# Patient Record
Sex: Male | Born: 1970 | Race: Black or African American | Hispanic: No | Marital: Married | Smoking: Never smoker
Health system: Southern US, Community
[De-identification: ages and names within clinical notes are randomized; demographics above are authoritative.]

## PROBLEM LIST (undated history)

## (undated) DIAGNOSIS — I10 Essential (primary) hypertension: Secondary | ICD-10-CM

## (undated) HISTORY — DX: Essential (primary) hypertension: I10

---

## 2004-07-16 ENCOUNTER — Emergency Department (HOSPITAL_COMMUNITY): Admission: EM | Admit: 2004-07-16 | Discharge: 2004-07-16 | Payer: Self-pay | Admitting: Emergency Medicine

## 2004-07-26 ENCOUNTER — Emergency Department (HOSPITAL_COMMUNITY): Admission: EM | Admit: 2004-07-26 | Discharge: 2004-07-26 | Payer: Self-pay | Admitting: Family Medicine

## 2005-09-29 ENCOUNTER — Emergency Department (HOSPITAL_COMMUNITY): Admission: EM | Admit: 2005-09-29 | Discharge: 2005-09-29 | Payer: Self-pay | Admitting: Emergency Medicine

## 2006-11-11 ENCOUNTER — Emergency Department (HOSPITAL_COMMUNITY): Admission: EM | Admit: 2006-11-11 | Discharge: 2006-11-11 | Payer: Self-pay | Admitting: Family Medicine

## 2010-12-01 LAB — GC/CHLAMYDIA PROBE AMP, GENITAL
Chlamydia, DNA Probe: NEGATIVE
GC Probe Amp, Genital: NEGATIVE

## 2012-05-11 ENCOUNTER — Other Ambulatory Visit: Payer: Self-pay | Admitting: *Deleted

## 2012-05-13 MED ORDER — OLMESARTAN MEDOXOMIL-HCTZ 40-25 MG PO TABS: 1.0000 | ORAL_TABLET | Freq: Every day | ORAL | Status: DC

## 2012-05-30 ENCOUNTER — Telehealth: Payer: Self-pay | Admitting: Physician Assistant

## 2012-05-30 NOTE — Telephone Encounter (Signed)
appt made

## 2012-06-03 ENCOUNTER — Ambulatory Visit (INDEPENDENT_AMBULATORY_CARE_PROVIDER_SITE_OTHER): Payer: Managed Care, Other (non HMO) | Admitting: Family Medicine

## 2012-06-03 ENCOUNTER — Encounter: Payer: Self-pay | Admitting: Family Medicine

## 2012-06-03 VITALS — BP 112/68 | HR 43 | Temp 97.1°F | Ht 72.5 in | Wt 249.0 lb

## 2012-06-03 DIAGNOSIS — IMO0002 Reserved for concepts with insufficient information to code with codable children: Secondary | ICD-10-CM

## 2012-06-03 DIAGNOSIS — M545 Low back pain, unspecified: Secondary | ICD-10-CM | POA: Insufficient documentation

## 2012-06-03 DIAGNOSIS — S39012A Strain of muscle, fascia and tendon of lower back, initial encounter: Secondary | ICD-10-CM

## 2012-06-03 DIAGNOSIS — I10 Essential (primary) hypertension: Secondary | ICD-10-CM | POA: Insufficient documentation

## 2012-06-03 DIAGNOSIS — E785 Hyperlipidemia, unspecified: Secondary | ICD-10-CM | POA: Insufficient documentation

## 2012-06-03 MED ORDER — OLMESARTAN MEDOXOMIL-HCTZ 40-25 MG PO TABS: 1.0000 | ORAL_TABLET | Freq: Every day | ORAL | Status: DC

## 2012-06-03 NOTE — Progress Notes (Signed)
Patient ID: Alex Howell, male   DOB: 1970-07-13, 42 y.o.   MRN: [id number] SUBJECTIVE: HPI: Pulled his back at work, was lifting a tote, at approximately 80 lbs. Symptoms 6 days Location:left lumbar Duration: 6 days                   is acute     is not chronic  is not associated with radiation of pain. is associated with an injury  have not had an injury of the back in the past. Severity of pain: 1-2   /10 when he moves Description of the pain: is not burning, is aching, is notlancinating,  is associated with the pain coming and going. is not associated with a rash. is better with anything tried or done.with ice is worse with anything tried or done.with movement is not associated with a change in strength, sensation, gait or a feeling of numbness, in the lower extremities. is not associated with a change in bowels and/or bladder. have not had an Xray of the back in the past.  Medications tried:icey hot Patient is here for follow up of hypertension: denies Headache;deniesChest Pain;denies weakness;denies Shortness of Breath or Orthopnea;denies Visual changes;denies palpitations;denies cough;denies pedal edema;denies symptoms of TIA or stroke; admits to Compliance with medications. denies Problems with medications.   PMH/PSH: reviewed/updated in Epic  SH/FH: reviewed/updated in Epic  Allergies: reviewed/updated in Epic  Medications: reviewed/updated in Epic  Immunizations: reviewed/updated in Epic  ROS: As above in the HPI. All other systems are stable or negative.  OBJECTIVE: APPEARANCE:  Obese african [place] male Patient in no acute distress.The patient appeared well nourished and normally developed. Acyanotic. Waist:42 1/4 inches VITAL SIGNS:BP 112/68  Pulse 43  Temp(Src) 97.1 F (36.2 C) (Oral)  Ht 6' 0.5" (1.842 m)  Wt 249 lb (112.946 kg)  BMI 33.29 kg/m2   SKIN: warm and  Dry without overt rashes, tattoos and scars  HEAD and Neck: without  JVD, Head and scalp: normal Eyes:No scleral icterus. Fundi normal, eye movements normal. Ears: Auricle normal, canal normal, Tympanic membranes normal, insufflation normal. Nose: normal Throat: normal Neck & thyroid: normal  CHEST & LUNGS: Chest wall: normal Lungs: Clear  CVS: Reveals the PMI to be normally located. Regular rhythm, First and Second Heart sounds are normal,  absence of murmurs, rubs or gallops. Peripheral vasculature: Radial pulses: normal Dorsal pedis pulses: normal Posterior pulses: normal  ABDOMEN:  Appearance: normal Benign,, no organomegaly, no masses, no Abdominal Aortic enlargement. No Guarding , no rebound. No Bruits. Bowel sounds: normal  RECTAL: N/A GU: N/A  EXTREMETIES: nonedematous. Both Femoral and Pedal pulses are normal.  MUSCULOSKELETAL:  Spine: left lumbar erector muscle mild tenderness. Joints: intact  NEUROLOGIC: oriented to time,place and person; nonfocal. Strength is normal Sensory is normal Reflexes are normal Cranial Nerves are normal.  ASSESSMENT: HTN, goal below 130/80 - Plan: COMPLETE METABOLIC PANEL WITH GFR  Dyslipidemia - Plan: NMR Lipoprofile with Lipids  Back sprain or strain, initial encounter  Obesity/metabolic syndrome: risks long term discussed at length.  PLAN:      Dr Woodroe Mode Recommendations  Diet and Exercise discussed with patient.  For nutrition information, I recommend books:  1).Eat to Live by Dr Monico Hoar. 2).Prevent and Reverse Heart Disease by Dr Suzzette Righter.  Exercise recommendations are:  If unable to walk, then the patient can exercise in a chair 3 times a day. By flapping arms like a bird gently and raising legs outwards to the front.  If ambulatory, the patient can go for walks for 30 minutes 3 times a week. Then increase the intensity and duration as tolerated.  Goal is to try to attain exercise frequency to 5 times a week.  If applicable: Best to perform  resistance exercises (machines or weights) 2 days a week and cardio type exercises 3 days per week.  Orders Placed This Encounter  Procedures  . COMPLETE METABOLIC PANEL WITH GFR    Standing Status: Future     Number of Occurrences:      Standing Expiration Date: 06/03/2013  . NMR Lipoprofile with Lipids    Standing Status: Future     Number of Occurrences:      Standing Expiration Date: 06/03/2013   No results found for this or any previous visit (from the past 24 hour(s)). Meds ordered this encounter  Medications  . olmesartan-hydrochlorothiazide (BENICAR HCT) 40-25 MG per tablet    Sig: Take 1 tablet by mouth daily.    Dispense:  30 tablet    Refill:  5    NTBS    back care, heat , muscle rub.(icey hot)  Compliance discussed. Handouts in the AVS Weight reduction exercise. Jakaiden Fill P. Modesto Charon, M.D.

## 2012-06-03 NOTE — Patient Instructions (Addendum)
Dr Woodroe Mode Recommendations  Diet and Exercise discussed with patient.  For nutrition information, I recommend books:  1).Eat to Live by Dr Monico Hoar. 2).Prevent and Reverse Heart Disease by Dr Suzzette Righter.  Exercise recommendations are:  If unable to walk, then the patient can exercise in a chair 3 times a day. By flapping arms like a bird gently and raising legs outwards to the front.  If ambulatory, the patient can go for walks for 30 minutes 3 times a week. Then increase the intensity and duration as tolerated.  Goal is to try to attain exercise frequency to 5 times a week.  If applicable: Best to perform resistance exercises (machines or weights) 2 days a week and cardio type exercises 3 days per week.   Hypertension As your heart beats, it forces blood through your arteries. This force is your blood pressure. If the pressure is too high, it is called hypertension (HTN) or high blood pressure. HTN is dangerous because you may have it and not know it. High blood pressure may mean that your heart has to work harder to pump blood. Your arteries may be narrow or stiff. The extra work puts you at risk for heart disease, stroke, and other problems.  Blood pressure consists of two numbers, a higher number over a lower, 110/72, for example. It is stated as "110 over 72." The ideal is below 120 for the top number (systolic) and under 80 for the bottom (diastolic). Write down your blood pressure today. You should pay close attention to your blood pressure if you have certain conditions such as:  Heart failure.  Prior heart attack.  Diabetes  Chronic kidney disease.  Prior stroke.  Multiple risk factors for heart disease. To see if you have HTN, your blood pressure should be measured while you are seated with your arm held at the level of the heart. It should be measured at least twice. A one-time elevated blood pressure reading (especially in the Emergency  Department) does not mean that you need treatment. There may be conditions in which the blood pressure is different between your right and left arms. It is important to see your caregiver soon for a recheck. Most people have essential hypertension which means that there is not a specific cause. This type of high blood pressure may be lowered by changing lifestyle factors such as:  Stress.  Smoking.  Lack of exercise.  Excessive weight.  Drug/tobacco/alcohol use.  Eating less salt. Most people do not have symptoms from high blood pressure until it has caused damage to the body. Effective treatment can often prevent, delay or reduce that damage. TREATMENT  When a cause has been identified, treatment for high blood pressure is directed at the cause. There are a large number of medications to treat HTN. These fall into several categories, and your caregiver will help you select the medicines that are best for you. Medications may have side effects. You should review side effects with your caregiver. If your blood pressure stays high after you have made lifestyle changes or started on medicines,   Your medication(s) may need to be changed.  Other problems may need to be addressed.  Be certain you understand your prescriptions, and know how and when to take your medicine.  Be sure to follow up with your caregiver within the time frame advised (usually within two weeks) to have your blood pressure rechecked and to review your medications.  If you are taking  more than one medicine to lower your blood pressure, make sure you know how and at what times they should be taken. Taking two medicines at the same time can result in blood pressure that is too low. SEEK IMMEDIATE MEDICAL CARE IF:  You develop a severe headache, blurred or changing vision, or confusion.  You have unusual weakness or numbness, or a faint feeling.  You have severe chest or abdominal pain, vomiting, or breathing  problems. MAKE SURE YOU:   Understand these instructions.  Will watch your condition.  Will get help right away if you are not doing well or get worse. Document Released: 02/06/2005 Document Revised: 05/01/2011 Document Reviewed: 09/27/2007 [facility] Patient Information 2013 [place], [place].

## 2012-12-03 ENCOUNTER — Ambulatory Visit: Payer: Managed Care, Other (non HMO) | Admitting: Family Medicine

## 2013-02-17 ENCOUNTER — Other Ambulatory Visit: Payer: Self-pay | Admitting: Nurse Practitioner

## 2013-05-30 ENCOUNTER — Ambulatory Visit (INDEPENDENT_AMBULATORY_CARE_PROVIDER_SITE_OTHER): Payer: Managed Care, Other (non HMO) | Admitting: Family Medicine

## 2013-05-30 VITALS — BP 147/89 | HR 51 | Temp 97.5°F | Ht 72.5 in | Wt 261.6 lb

## 2013-05-30 DIAGNOSIS — IMO0002 Reserved for concepts with insufficient information to code with codable children: Secondary | ICD-10-CM

## 2013-05-30 DIAGNOSIS — S76312A Strain of muscle, fascia and tendon of the posterior muscle group at thigh level, left thigh, initial encounter: Secondary | ICD-10-CM

## 2013-05-30 MED ORDER — NAPROXEN 500 MG PO TABS: 500.0000 mg | ORAL_TABLET | Freq: Two times a day (BID) | ORAL | Status: DC

## 2013-05-30 NOTE — Progress Notes (Signed)
   Subjective:    Patient ID: Encarnacion ChuMilton D Spurgin, male    DOB: 1970/09/16, 43 y.o.   MRN: [id number]  HPI  This 43 y.o. male presents for evaluation of left leg pain and discomfort.  He was out racing the [employer] yesterday and pulled a muscle in his left leg.  Review of Systems    No chest pain, SOB, HA, dizziness, vision change, N/V, diarrhea, constipation, dysuria, urinary urgency or frequency, myalgias, arthralgias or rash.  Objective:   Physical Exam Vital signs noted  Well developed well nourished male.  HEENT - Head atraumatic Normocephalic MS - TTP left hamstring        Assessment & Plan:  Left hamstring muscle strain - Plan: naproxen (NAPROSYN) 500 MG tablet Po bid x 2 weeks.  Use heating pad prn  Deatra CanterWilliam J Venda Dice FNP

## 2013-06-20 ENCOUNTER — Other Ambulatory Visit: Payer: Self-pay | Admitting: Family Medicine

## 2013-06-30 ENCOUNTER — Ambulatory Visit (INDEPENDENT_AMBULATORY_CARE_PROVIDER_SITE_OTHER): Payer: Managed Care, Other (non HMO) | Admitting: Family

## 2013-06-30 ENCOUNTER — Encounter: Payer: Self-pay | Admitting: Family

## 2013-06-30 VITALS — BP 154/86 | HR 46 | Temp 98.7°F | Ht 72.05 in | Wt 258.0 lb

## 2013-06-30 DIAGNOSIS — Z0000 Encounter for general adult medical examination without abnormal findings: Secondary | ICD-10-CM

## 2013-06-30 DIAGNOSIS — Z23 Encounter for immunization: Secondary | ICD-10-CM

## 2013-06-30 DIAGNOSIS — I10 Essential (primary) hypertension: Secondary | ICD-10-CM

## 2013-06-30 MED ORDER — OLMESARTAN MEDOXOMIL-HCTZ 40-25 MG PO TABS
1.0000 | ORAL_TABLET | Freq: Every day | ORAL | Status: DC
Start: 2013-06-30 — End: 2014-05-01

## 2013-06-30 NOTE — Patient Instructions (Addendum)
Tetanus, Diphtheria, Pertussis (Tdap) Vaccine What You Need to Know WHY GET VACCINATED? Tetanus, diphtheria and pertussis can be very serious diseases, even for adolescents and adults. Tdap vaccine can protect us from these diseases. TETANUS (Lockjaw) causes painful muscle tightening and stiffness, usually all over the body.  It can lead to tightening of muscles in the head and neck so you can't open your mouth, swallow, or sometimes even breathe. Tetanus kills about 1 out of 5 people who are infected. DIPHTHERIA can cause a thick coating to form in the back of the throat.  It can lead to breathing problems, paralysis, heart failure, and death. PERTUSSIS (Whooping Cough) causes severe coughing spells, which can cause difficulty breathing, vomiting and disturbed sleep.  It can also lead to weight loss, incontinence, and rib fractures. Up to 2 in 100 adolescents and 5 in 100 adults with pertussis are hospitalized or have complications, which could include pneumonia and death. These diseases are caused by bacteria. Diphtheria and pertussis are spread from person to person through coughing or sneezing. Tetanus enters the body through cuts, scratches, or wounds. Before vaccines, the [place] saw as many as 200,000 cases a year of diphtheria and pertussis, and hundreds of cases of tetanus. Since vaccination began, tetanus and diphtheria have dropped by about 99% and pertussis by about 80%. TDAP VACCINE Tdap vaccine can protect adolescents and adults from tetanus, diphtheria, and pertussis. One dose of Tdap is routinely given at age 11 or 12. People who did not get Tdap at that age should get it as soon as possible. Tdap is especially important for health care professionals and anyone having close contact with a baby younger than 12 months. Pregnant women should get a dose of Tdap during every pregnancy, to protect the newborn from pertussis. Infants are most at risk for severe, life-threatening  complications from pertussis. A similar vaccine, called Td, protects from tetanus and diphtheria, but not pertussis. A Td booster should be given every 10 years. Tdap may be given as one of these boosters if you have not already gotten a dose. Tdap may also be given after a severe cut or burn to prevent tetanus infection. Your doctor can give you more information. Tdap may safely be given at the same time as other vaccines. SOME PEOPLE SHOULD NOT GET THIS VACCINE  If you ever had a life-threatening allergic reaction after a dose of any tetanus, diphtheria, or pertussis containing vaccine, OR if you have a severe allergy to any part of this vaccine, you should not get Tdap. Tell your doctor if you have any severe allergies.  If you had a coma, or long or multiple seizures within 7 days after a childhood dose of DTP or DTaP, you should not get Tdap, unless a cause other than the vaccine was found. You can still get Td.  Talk to your doctor if you:  have epilepsy or another nervous system problem,  had severe pain or swelling after any vaccine containing diphtheria, tetanus or pertussis,  ever had Guillain-Barr Syndrome (GBS),  aren't feeling well on the day the shot is scheduled. RISKS OF A VACCINE REACTION With any medicine, including vaccines, there is a chance of side effects. These are usually mild and go away on their own, but serious reactions are also possible. Brief fainting spells can follow a vaccination, leading to injuries from falling. Sitting or lying down for about 15 minutes can help prevent these. Tell your doctor if you feel dizzy or light-headed, or   have vision changes or ringing in the ears. Mild problems following Tdap (Did not interfere with activities)  Pain where the shot was given (about 3 in 4 adolescents or 2 in 3 adults)  Redness or swelling where the shot was given (about 1 person in 5)  Mild fever of at least 100.4F (up to about 1 in 25 adolescents or 1 in  100 adults)  Headache (about 3 or 4 people in 10)  Tiredness (about 1 person in 3 or 4)  Nausea, vomiting, diarrhea, stomach ache (up to 1 in 4 adolescents or 1 in 10 adults)  Chills, body aches, sore joints, rash, swollen glands (uncommon) Moderate problems following Tdap (Interfered with activities, but did not require medical attention)  Pain where the shot was given (about 1 in 5 adolescents or 1 in 100 adults)  Redness or swelling where the shot was given (up to about 1 in 16 adolescents or 1 in 25 adults)  Fever over 102F (about 1 in 100 adolescents or 1 in 250 adults)  Headache (about 3 in 20 adolescents or 1 in 10 adults)  Nausea, vomiting, diarrhea, stomach ache (up to 1 or 3 people in 100)  Swelling of the entire arm where the shot was given (up to about 3 in 100). Severe problems following Tdap (Unable to perform usual activities, required medical attention)  Swelling, severe pain, bleeding and redness in the arm where the shot was given (rare). A severe allergic reaction could occur after any vaccine (estimated less than 1 in a million doses). WHAT IF THERE IS A SERIOUS REACTION? What should I look for?  Look for anything that concerns you, such as signs of a severe allergic reaction, very high fever, or behavior changes. Signs of a severe allergic reaction can include hives, swelling of the face and throat, difficulty breathing, a fast heartbeat, dizziness, and weakness. These would start a few minutes to a few hours after the vaccination. What should I do?  If you think it is a severe allergic reaction or other emergency that can't wait, call 9-1-1 or get the person to the nearest hospital. Otherwise, call your doctor.  Afterward, the reaction should be reported to the "Vaccine Adverse Event Reporting System" (VAERS). Your doctor might file this report, or you can do it yourself through the VAERS web site at www.vaers.hhs.gov, or by calling 1-800-822-7967. VAERS is  only for reporting reactions. They do not give medical advice.  THE NATIONAL VACCINE INJURY COMPENSATION PROGRAM The National Vaccine Injury Compensation Program (VICP) is a federal program that was created to compensate people who may have been injured by certain vaccines. Persons who believe they may have been injured by a vaccine can learn about the program and about filing a claim by calling 1-800-338-2382 or visiting the VICP website at www.hrsa.gov/vaccinecompensation. HOW CAN I LEARN MORE?  Ask your doctor.  Call your local or state health department.  Contact the Centers for Disease Control and Prevention (CDC):  Call 1-800-232-4636 or visit CDC's website at www.cdc.gov/vaccines. CDC Tdap Vaccine VIS (06/29/11) Document Released: 08/08/2011 Document Revised: 06/03/2012 Document Reviewed: 05/29/2012 [employer] Patient Information 2014 [employer]. Hypertension As your heart beats, it forces blood through your arteries. This force is your blood pressure. If the pressure is too high, it is called hypertension (HTN) or high blood pressure. HTN is dangerous because you may have it and not know it. High blood pressure may mean that your heart has to work harder to pump blood. Your arteries   may be narrow or stiff. The extra work puts you at risk for heart disease, stroke, and other problems.  Blood pressure consists of two numbers, a higher number over a lower, 110/72, for example. It is stated as "110 over 72." The ideal is below 120 for the top number (systolic) and under 80 for the bottom (diastolic). Write down your blood pressure today. You should pay close attention to your blood pressure if you have certain conditions such as:  Heart failure.  Prior heart attack.  Diabetes  Chronic kidney disease.  Prior stroke.  Multiple risk factors for heart disease. To see if you have HTN, your blood pressure should be measured while you are seated with your arm held at the level of the  heart. It should be measured at least twice. A one-time elevated blood pressure reading (especially in the Emergency Department) does not mean that you need treatment. There may be conditions in which the blood pressure is different between your right and left arms. It is important to see your caregiver soon for a recheck. Most people have essential hypertension which means that there is not a specific cause. This type of high blood pressure may be lowered by changing lifestyle factors such as:  Stress.  Smoking.  Lack of exercise.  Excessive weight.  Drug/tobacco/alcohol use.  Eating less salt. Most people do not have symptoms from high blood pressure until it has caused damage to the body. Effective treatment can often prevent, delay or reduce that damage. TREATMENT  When a cause has been identified, treatment for high blood pressure is directed at the cause. There are a large number of medications to treat HTN. These fall into several categories, and your caregiver will help you select the medicines that are best for you. Medications may have side effects. You should review side effects with your caregiver. If your blood pressure stays high after you have made lifestyle changes or started on medicines,   Your medication(s) may need to be changed.  Other problems may need to be addressed.  Be certain you understand your prescriptions, and know how and when to take your medicine.  Be sure to follow up with your caregiver within the time frame advised (usually within two weeks) to have your blood pressure rechecked and to review your medications.  If you are taking more than one medicine to lower your blood pressure, make sure you know how and at what times they should be taken. Taking two medicines at the same time can result in blood pressure that is too low. SEEK IMMEDIATE MEDICAL CARE IF:  You develop a severe headache, blurred or changing vision, or confusion.  You have unusual  weakness or numbness, or a faint feeling.  You have severe chest or abdominal pain, vomiting, or breathing problems. MAKE SURE YOU:   Understand these instructions.  Will watch your condition.  Will get help right away if you are not doing well or get worse. Document Released: 02/06/2005 Document Revised: 05/01/2011 Document Reviewed: 09/27/2007 [employer] Patient Information 2014 [employer].  

## 2013-06-30 NOTE — Progress Notes (Signed)
   Subjective:    Patient ID: Alex ChuMilton Howell Nurse, male    DOB: [birth year], 43 y.o.   MRN: [id number]  Hypertension This is a chronic problem. The current episode started more than 1 year ago. The problem has been waxing and waning since onset. The problem is uncontrolled (Pt has not taken BP meds last two weeks-States he "ran out"). Pertinent negatives include no anxiety, blurred vision, chest pain, palpitations, peripheral edema or shortness of breath. Risk factors for coronary artery disease include male gender and obesity. Past treatments include lifestyle changes (Benicar). The current treatment provides moderate improvement. Compliance problems include exercise.  There is no history of kidney disease, CAD/MI, heart failure or a thyroid problem.      Review of Systems  Eyes: Negative for blurred vision.  Respiratory: Negative for shortness of breath.   Cardiovascular: Negative for chest pain and palpitations.  All other systems reviewed and are negative.      Objective:   Physical Exam  Vitals reviewed. Constitutional: He is oriented to person, place, and time. He appears well-developed and well-nourished. No distress.  HENT:  Head: Normocephalic.  Right Ear: External ear normal.  Left Ear: External ear normal.  Mouth/Throat: Oropharynx is clear and moist.  Eyes: Pupils are equal, round, and reactive to light. Right eye exhibits no discharge. Left eye exhibits no discharge.  Neck: Normal range of motion. Neck supple. No thyromegaly present.  Cardiovascular: Normal rate, regular rhythm, normal heart sounds and intact distal pulses.   No murmur heard. Pulmonary/Chest: Effort normal and breath sounds normal. No respiratory distress. He has no wheezes.  Abdominal: Soft. Bowel sounds are normal. He exhibits no distension. There is no tenderness.  Musculoskeletal: Normal range of motion. He exhibits no edema and no tenderness.  Neurological: He is alert and oriented to person, place,  and time. He has normal reflexes. No cranial nerve deficit.  Skin: Skin is warm and dry. No rash noted. No erythema.  Psychiatric: He has a normal mood and affect. His behavior is normal. Judgment and thought content normal.     BP 154/86  Pulse 46  Temp(Src) 98.7 F (37.1 C) (Oral)  Ht 6' 0.05" (1.83 m)  Wt 258 lb (117.028 kg)  BMI 34.95 kg/m2      Assessment & Plan:  1. HTN, goal below 130/80 -Daily blood pressure log given with instructions on how to fill out and told to bring to next visit -Dash diet information given -Exercise encouraged - Stress Management  -Continue current meds- Pt has not taken any BP meds for the last two weeks -Pt educated if BP not <130s/80s to schedule appointment for BP meds to be increased - olmesartan-hydrochlorothiazide (BENICAR HCT) 40-25 MG per tablet; Take 1 tablet by mouth daily.  Dispense: 90 tablet; Refill: 3 - Basic Metabolic Panel  2. Annual physical exam - Basic Metabolic Panel - PSA - Lipid Panel   Continue all meds Labs pending Health Maintenance reviewed Diet and exercise encouraged RTO 1 year or if BP does not come to goal pt will make f/u with next 3-4 weeks  Jannifer Rodneyhristy Novah Nessel, FNP

## 2013-07-01 LAB — LIPID PANEL
Chol/HDL Ratio: 4.4 ratio units (ref 0.0–5.0)
Cholesterol, Total: 171 mg/dL (ref 100–199)
HDL: 39 mg/dL — AB (ref >39)
LDL CALC: 119 mg/dL — AB (ref 0–99)
Triglycerides: 66 mg/dL (ref 0–149)
VLDL CHOLESTEROL CAL: 13 mg/dL (ref 5–40)

## 2013-07-01 LAB — BASIC METABOLIC PANEL
BUN / CREAT RATIO: 9 (ref 9–20)
BUN: 8 mg/dL (ref 6–24)
CHLORIDE: 105 mmol/L (ref 97–108)
CO2: 25 mmol/L (ref 18–29)
Calcium: 9.4 mg/dL (ref 8.7–10.2)
Creatinine, Ser: 0.85 mg/dL (ref 0.76–1.27)
GFR, EST AFRICAN AMERICAN: 123 mL/min/{1.73_m2} (ref >59)
GFR, EST NON AFRICAN AMERICAN: 107 mL/min/{1.73_m2} (ref >59)
GLUCOSE: 101 mg/dL — AB (ref 65–99)
POTASSIUM: 4.3 mmol/L (ref 3.5–5.2)
SODIUM: 141 mmol/L (ref 134–144)

## 2013-07-01 LAB — PSA: PSA: 0.8 ng/mL (ref 0.0–4.0)

## 2014-05-01 ENCOUNTER — Other Ambulatory Visit: Payer: Self-pay | Admitting: Family

## 2014-05-11 ENCOUNTER — Ambulatory Visit (INDEPENDENT_AMBULATORY_CARE_PROVIDER_SITE_OTHER): Payer: Managed Care, Other (non HMO) | Admitting: Family

## 2014-05-11 ENCOUNTER — Encounter: Payer: Self-pay | Admitting: Family

## 2014-05-11 VITALS — BP 148/85 | HR 50 | Temp 97.4°F | Ht 72.0 in | Wt 256.0 lb

## 2014-05-11 DIAGNOSIS — I10 Essential (primary) hypertension: Secondary | ICD-10-CM | POA: Diagnosis not present

## 2014-05-11 DIAGNOSIS — Z0000 Encounter for general adult medical examination without abnormal findings: Secondary | ICD-10-CM

## 2014-05-11 DIAGNOSIS — E785 Hyperlipidemia, unspecified: Secondary | ICD-10-CM

## 2014-05-11 MED ORDER — OLMESARTAN MEDOXOMIL-HCTZ 40-25 MG PO TABS: 1.0000 | ORAL_TABLET | Freq: Every day | ORAL | Status: DC

## 2014-05-11 NOTE — Patient Instructions (Addendum)
DASH Eating Plan DASH stands for "Dietary Approaches to Stop Hypertension." The DASH eating plan is a healthy eating plan that has been shown to reduce high blood pressure (hypertension). Additional health benefits may include reducing the risk of type 2 diabetes mellitus, heart disease, and stroke. The DASH eating plan may also help with weight loss. WHAT DO I NEED TO KNOW ABOUT THE DASH EATING PLAN? For the DASH eating plan, you will follow these general guidelines:  Choose foods with a percent daily value for sodium of less than 5% (as listed on the food label).  Use salt-free seasonings or herbs instead of table salt or sea salt.  Check with your health care provider or pharmacist before using salt substitutes.  Eat lower-sodium products, often labeled as "lower sodium" or "no salt added."  Eat fresh foods.  Eat more vegetables, fruits, and low-fat dairy products.  Choose whole grains. Look for the word "whole" as the first word in the ingredient list.  Choose fish and skinless chicken or turkey more often than red meat. Limit fish, poultry, and meat to 6 oz (170 g) each day.  Limit sweets, desserts, sugars, and sugary drinks.  Choose heart-healthy fats.  Limit cheese to 1 oz (28 g) per day.  Eat more home-cooked food and less restaurant, buffet, and fast food.  Limit fried foods.  Cook foods using methods other than frying.  Limit canned vegetables. If you do use them, rinse them well to decrease the sodium.  When eating at a restaurant, ask that your food be prepared with less salt, or no salt if possible. WHAT FOODS CAN I EAT? Seek help from a dietitian for individual calorie needs. Grains Whole grain or whole wheat bread. Brown rice. Whole grain or whole wheat pasta. Quinoa, bulgur, and whole grain cereals. Low-sodium cereals. Corn or whole wheat flour tortillas. Whole grain cornbread. Whole grain crackers. Low-sodium crackers. Vegetables Fresh or frozen vegetables  (raw, steamed, roasted, or grilled). Low-sodium or reduced-sodium tomato and vegetable juices. Low-sodium or reduced-sodium tomato sauce and paste. Low-sodium or reduced-sodium canned vegetables.  Fruits All fresh, canned (in natural juice), or frozen fruits. Meat and Other Protein Products Ground beef (85% or leaner), grass-fed beef, or beef trimmed of fat. Skinless chicken or turkey. Ground chicken or turkey. Pork trimmed of fat. All fish and seafood. Eggs. Dried beans, peas, or lentils. Unsalted nuts and seeds. Unsalted canned beans. Dairy Low-fat dairy products, such as skim or 1% milk, 2% or reduced-fat cheeses, low-fat ricotta or cottage cheese, or plain low-fat yogurt. Low-sodium or reduced-sodium cheeses. Fats and Oils Tub margarines without trans fats. Light or reduced-fat mayonnaise and salad dressings (reduced sodium). Avocado. Safflower, olive, or canola oils. Natural peanut or almond butter. Other Unsalted popcorn and pretzels. The items listed above may not be a complete list of recommended foods or beverages. Contact your dietitian for more options. WHAT FOODS ARE NOT RECOMMENDED? Grains White bread. White pasta. White rice. Refined cornbread. Bagels and croissants. Crackers that contain trans fat. Vegetables Creamed or fried vegetables. Vegetables in a cheese sauce. Regular canned vegetables. Regular canned tomato sauce and paste. Regular tomato and vegetable juices. Fruits Dried fruits. Canned fruit in light or heavy syrup. Fruit juice. Meat and Other Protein Products Fatty cuts of meat. Ribs, chicken wings, bacon, sausage, bologna, salami, chitterlings, fatback, hot dogs, bratwurst, and packaged luncheon meats. Salted nuts and seeds. Canned beans with salt. Dairy Whole or 2% milk, cream, half-and-half, and cream cheese. Whole-fat or sweetened yogurt. Full-fat   cheeses or blue cheese. Nondairy creamers and whipped toppings. Processed cheese, cheese spreads, or cheese  curds. Condiments Onion and garlic salt, seasoned salt, table salt, and sea salt. Canned and packaged gravies. Worcestershire sauce. Tartar sauce. Barbecue sauce. Teriyaki sauce. Soy sauce, including reduced sodium. Steak sauce. Fish sauce. Oyster sauce. Cocktail sauce. Horseradish. Ketchup and mustard. Meat flavorings and tenderizers. Bouillon cubes. Hot sauce. Tabasco sauce. Marinades. Taco seasonings. Relishes. Fats and Oils Butter, stick margarine, lard, shortening, ghee, and bacon fat. Coconut, palm kernel, or palm oils. Regular salad dressings. Other Pickles and olives. Salted popcorn and pretzels. The items listed above may not be a complete list of foods and beverages to avoid. Contact your dietitian for more information. WHERE CAN I FIND MORE INFORMATION? National Heart, Lung, and Blood Institute: travelstabloid.com Document Released: 01/26/2011 Document Revised: 06/23/2013 Document Reviewed: 12/11/2012 [facility] Patient Information 2015 [place], [place]. This information is not intended to replace advice given to you by your health care provider. Make sure you discuss any questions you have with your health care provider. Hypertension Hypertension, commonly called high blood pressure, is when the force of blood pumping through your arteries is too strong. Your arteries are the blood vessels that carry blood from your heart throughout your body. A blood pressure reading consists of a higher number over a lower number, such as 110/72. The higher number (systolic) is the pressure inside your arteries when your heart pumps. The lower number (diastolic) is the pressure inside your arteries when your heart relaxes. Ideally you want your blood pressure below 120/80. Hypertension forces your heart to work harder to pump blood. Your arteries may become narrow or stiff. Having hypertension puts you at risk for heart disease, stroke, and other problems.  RISK  FACTORS Some risk factors for high blood pressure are controllable. Others are not.  Risk factors you cannot control include:   Race. You may be at higher risk if you are African American.  Age. Risk increases with age.  Gender. Men are at higher risk than women before age 37 years. After age 55, women are at higher risk than men. Risk factors you can control include:  Not getting enough exercise or physical activity.  Being overweight.  Getting too much fat, sugar, calories, or salt in your diet.  Drinking too much alcohol. SIGNS AND SYMPTOMS Hypertension does not usually cause signs or symptoms. Extremely high blood pressure (hypertensive crisis) may cause headache, anxiety, shortness of breath, and nosebleed. DIAGNOSIS  To check if you have hypertension, your health care provider will measure your blood pressure while you are seated, with your arm held at the level of your heart. It should be measured at least twice using the same arm. Certain conditions can cause a difference in blood pressure between your right and left arms. A blood pressure reading that is higher than normal on one occasion does not mean that you need treatment. If one blood pressure reading is high, ask your health care provider about having it checked again. TREATMENT  Treating high blood pressure includes making lifestyle changes and possibly taking medicine. Living a healthy lifestyle can help lower high blood pressure. You may need to change some of your habits. Lifestyle changes may include:  Following the DASH diet. This diet is high in fruits, vegetables, and whole grains. It is low in salt, red meat, and added sugars.  Getting at least 2 hours of brisk physical activity every week.  Losing weight if necessary.  Not smoking.  Limiting  alcoholic beverages.  Learning ways to reduce stress. If lifestyle changes are not enough to get your blood pressure under control, your health care provider may  prescribe medicine. You may need to take more than one. Work closely with your health care provider to understand the risks and benefits. HOME CARE INSTRUCTIONS  Have your blood pressure rechecked as directed by your health care provider.   Take medicines only as directed by your health care provider. Follow the directions carefully. Blood pressure medicines must be taken as prescribed. The medicine does not work as well when you skip doses. Skipping doses also puts you at risk for problems.   Do not smoke.   Monitor your blood pressure at home as directed by your health care provider. SEEK MEDICAL CARE IF:   You think you are having a reaction to medicines taken.  You have recurrent headaches or feel dizzy.  You have swelling in your ankles.  You have trouble with your vision. SEEK IMMEDIATE MEDICAL CARE IF:  You develop a severe headache or confusion.  You have unusual weakness, numbness, or feel faint.  You have severe chest or abdominal pain.  You vomit repeatedly.  You have trouble breathing. MAKE SURE YOU:   Understand these instructions.  Will watch your condition.  Will get help right away if you are not doing well or get worse. Document Released: 02/06/2005 Document Revised: 06/23/2013 Document Reviewed: 11/29/2012 [facility] Patient Information 2015 [place], [place]. This information is not intended to replace advice given to you by your health care provider. Make sure you discuss any questions you have with your health care provider. Health Maintenance A healthy lifestyle and preventative care can promote health and wellness.  Maintain regular health, dental, and eye exams.  Eat a healthy diet. Foods like vegetables, fruits, whole grains, low-fat dairy products, and lean protein foods contain the nutrients you need and are low in calories. Decrease your intake of foods high in solid fats, added sugars, and salt. Get information about a proper diet from your  health care provider, if necessary.  Regular physical exercise is one of the most important things you can do for your health. Most adults should get at least 150 minutes of moderate-intensity exercise (any activity that increases your heart rate and causes you to sweat) each week. In addition, most adults need muscle-strengthening exercises on 2 or more days a week.   Maintain a healthy weight. The body mass index (BMI) is a screening tool to identify possible weight problems. It provides an estimate of body fat based on height and weight. Your health care provider can find your BMI and can help you achieve or maintain a healthy weight. For males 20 years and older:  A BMI below 18.5 is considered underweight.  A BMI of 18.5 to 24.9 is normal.  A BMI of 25 to 29.9 is considered overweight.  A BMI of 30 and above is considered obese.  Maintain normal blood lipids and cholesterol by exercising and minimizing your intake of saturated fat. Eat a balanced diet with plenty of fruits and vegetables. Blood tests for lipids and cholesterol should begin at age 49 and be repeated every 5 years. If your lipid or cholesterol levels are high, you are over age 61, or you are at high risk for heart disease, you may need your cholesterol levels checked more frequently.Ongoing high lipid and cholesterol levels should be treated with medicines if diet and exercise are not working.  If you smoke, find  out from your health care provider how to quit. If you do not use tobacco, do not start.  Lung cancer screening is recommended for adults aged 55-80 years who are at high risk for developing lung cancer because of a history of smoking. A yearly low-dose CT scan of the lungs is recommended for people who have at least a 30-pack-year history of smoking and are current smokers or have quit within the past 15 years. A pack year of smoking is smoking an average of 1 pack of cigarettes a day for 1 year (for example, a  30-pack-year history of smoking could mean smoking 1 pack a day for 30 years or 2 packs a day for 15 years). Yearly screening should continue until the smoker has stopped smoking for at least 15 years. Yearly screening should be stopped for people who develop a health problem that would prevent them from having lung cancer treatment.  If you choose to drink alcohol, do not have more than 2 drinks per day. One drink is considered to be 12 oz (360 mL) of beer, 5 oz (150 mL) of wine, or 1.5 oz (45 mL) of liquor.  Avoid the use of street drugs. Do not share needles with anyone. Ask for help if you need support or instructions about stopping the use of drugs.  High blood pressure causes heart disease and increases the risk of stroke. Blood pressure should be checked at least every 1-2 years. Ongoing high blood pressure should be treated with medicines if weight loss and exercise are not effective.  If you are 1945-45 years old, ask your health care provider if you should take aspirin to prevent heart disease.  Diabetes screening involves taking a blood sample to check your fasting blood sugar level. This should be done once every 3 years after age 45 if you are at a normal weight and without risk factors for diabetes. Testing should be considered at a younger age or be carried out more frequently if you are overweight and have at least 1 risk factor for diabetes.  Colorectal cancer can be detected and often prevented. Most routine colorectal cancer screening begins at the age of 45 and continues through age 45. However, your health care provider may recommend screening at an earlier age if you have risk factors for colon cancer. On a yearly basis, your health care provider may provide home test kits to check for hidden blood in the stool. A small camera at the end of a tube may be used to directly examine the colon (sigmoidoscopy or colonoscopy) to detect the earliest forms of colorectal cancer. Talk to your  health care provider about this at age 45 when routine screening begins. A direct exam of the colon should be repeated every 5-10 years through age 45, unless early forms of precancerous polyps or small growths are found.  People who are at an increased risk for hepatitis B should be screened for this virus. You are considered at high risk for hepatitis B if:  You were born in a country where hepatitis B occurs often. Talk with your health care provider about which countries are considered high risk.  Your parents were born in a high-risk country and you have not received a shot to protect against hepatitis B (hepatitis B vaccine).  You have HIV or AIDS.  You use needles to inject street drugs.  You live with, or have sex with, someone who has hepatitis B.  You are a man who has  sex with other men (MSM).  You get hemodialysis treatment.  You take certain medicines for conditions like cancer, organ transplantation, and autoimmune conditions.  Hepatitis C blood testing is recommended for all people born from [birth year] through 1966 and any individual with known risk factors for hepatitis C.  Healthy men should no longer receive prostate-specific antigen (PSA) blood tests as part of routine cancer screening. Talk to your health care provider about prostate cancer screening.  Testicular cancer screening is not recommended for adolescents or adult males who have no symptoms. Screening includes self-exam, a health care provider exam, and other screening tests. Consult with your health care provider about any symptoms you have or any concerns you have about testicular cancer.  Practice safe sex. Use condoms and avoid high-risk sexual practices to reduce the spread of sexually transmitted infections (STIs).  You should be screened for STIs, including gonorrhea and chlamydia if:  You are sexually active and are younger than 24 years.  You are older than 24 years, and your health care provider tells  you that you are at risk for this type of infection.  Your sexual activity has changed since you were last screened, and you are at an increased risk for chlamydia or gonorrhea. Ask your health care provider if you are at risk.  If you are at risk of being infected with HIV, it is recommended that you take a prescription medicine daily to prevent HIV infection. This is called pre-exposure prophylaxis (PrEP). You are considered at risk if:  You are a man who has sex with other men (MSM).  You are a heterosexual man who is sexually active with multiple partners.  You take drugs by injection.  You are sexually active with a partner who has HIV.  Talk with your health care provider about whether you are at high risk of being infected with HIV. If you choose to begin PrEP, you should first be tested for HIV. You should then be tested every 3 months for as long as you are taking PrEP.  Use sunscreen. Apply sunscreen liberally and repeatedly throughout the day. You should seek shade when your shadow is shorter than you. Protect yourself by wearing long sleeves, pants, a wide-brimmed hat, and sunglasses year round whenever you are outdoors.  Tell your health care provider of new moles or changes in moles, especially if there is a change in shape or color. Also, tell your health care provider if a mole is larger than the size of a pencil eraser.  A one-time screening for abdominal aortic aneurysm (AAA) and surgical repair of large AAAs by ultrasound is recommended for men aged 65-75 years who are current or former smokers.  Stay current with your vaccines (immunizations). Document Released: 08/05/2007 Document Revised: 02/11/2013 Document Reviewed: 07/04/2010 [facility] Patient Information 2015 [place], [place]. This information is not intended to replace advice given to you by your health care provider. Make sure you discuss any questions you have with your health care provider.

## 2014-05-11 NOTE — Progress Notes (Signed)
   Subjective:    Patient ID: Alex Howell, male    DOB: 01-May-1970, 44 y.o.   MRN: [id number]  Hypertension This is a chronic problem. The current episode started more than 1 year ago. The problem has been waxing and waning since onset. The problem is uncontrolled (Pt has not taken BP meds last two weeks-States he "ran out"). Pertinent negatives include no anxiety, blurred vision, chest pain, palpitations, peripheral edema or shortness of breath. Risk factors for coronary artery disease include male gender and obesity. Past treatments include ACE inhibitors (Benicar). The current treatment provides moderate improvement. Compliance problems include exercise.  There is no history of kidney disease, CAD/MI, heart failure or a thyroid problem.      Review of Systems  Constitutional: Negative.   HENT: Negative.   Eyes: Negative for blurred vision.  Respiratory: Negative.  Negative for shortness of breath.   Cardiovascular: Negative.  Negative for chest pain and palpitations.  Gastrointestinal: Negative.   Endocrine: Negative.   Genitourinary: Negative.   Musculoskeletal: Negative.   Neurological: Negative.   Hematological: Negative.   Psychiatric/Behavioral: Negative.   All other systems reviewed and are negative.      Objective:   Physical Exam  Constitutional: He is oriented to person, place, and time. He appears well-developed and well-nourished. No distress.  HENT:  Head: Normocephalic.  Right Ear: External ear normal.  Left Ear: External ear normal.  Nose: Nose normal.  Mouth/Throat: Oropharynx is clear and moist.  Eyes: Pupils are equal, round, and reactive to light. Right eye exhibits no discharge. Left eye exhibits no discharge.  Neck: Normal range of motion. Neck supple. No thyromegaly present.  Cardiovascular: Normal rate, regular rhythm, normal heart sounds and intact distal pulses.   No murmur heard. Pulmonary/Chest: Effort normal and breath sounds normal. No  respiratory distress. He has no wheezes.  Abdominal: Soft. Bowel sounds are normal. He exhibits no distension. There is no tenderness.  Musculoskeletal: Normal range of motion. He exhibits no edema or tenderness.  Neurological: He is alert and oriented to person, place, and time. He has normal reflexes. No cranial nerve deficit.  Skin: Skin is warm and dry. No rash noted. No erythema.  Psychiatric: He has a normal mood and affect. His behavior is normal. Judgment and thought content normal.  Vitals reviewed.   BP 148/85 mmHg  Pulse 50  Temp(Src) 97.4 F (36.3 C) (Oral)  Ht 6' (1.829 m)  Wt 256 lb (116.121 kg)  BMI 34.71 kg/m2       Assessment & Plan:  1. HTN, goal below 130/80 - olmesartan-hydrochlorothiazide (BENICAR HCT) 40-25 MG per tablet; Take 1 tablet by mouth daily.  Dispense: 90 tablet; Refill: 3 - CMP14+EGFR; Future  2. Dyslipidemia - CMP14+EGFR; Future - Lipid panel; Future  3. Annual physical exam - CMP14+EGFR; Future - Lipid panel; Future - Vit D  25 hydroxy (rtn osteoporosis monitoring); Future - Thyroid Panel With TSH; Future   Continue all meds Labs pending Health Maintenance reviewed Diet and exercise encouraged RTO 1 year  Evelina Dun, FNP

## 2014-06-18 ENCOUNTER — Telehealth: Payer: Self-pay

## 2014-06-18 NOTE — Telephone Encounter (Signed)
Insurance denied prior authorization for Benicar HCT 40-25  Needs to have failure or contraindication to Benazepril/HCt, candesartan HCTZ, captopril/HCTZ, enalapril HCTZ. Eprosartan, fosinopril/HCTZ. Irbesartan/HCTZ, lisinopril/HCTZ, losartan/HCTZ, moexipril/HCTZ, perindopril, quinapril,HCTZ, ramipril, telmisartan/HCTZ trandolapril or valsartan/HCTZ

## 2014-06-19 MED ORDER — LISINOPRIL-HYDROCHLOROTHIAZIDE 20-12.5 MG PO TABS: 2.0000 | ORAL_TABLET | Freq: Every day | ORAL | Status: DC

## 2014-06-19 NOTE — Telephone Encounter (Signed)
New rx sent to pharmacy

## 2014-07-06 ENCOUNTER — Other Ambulatory Visit: Payer: Self-pay | Admitting: Physician Assistant

## 2014-07-06 ENCOUNTER — Encounter (INDEPENDENT_AMBULATORY_CARE_PROVIDER_SITE_OTHER): Payer: Self-pay

## 2014-07-06 ENCOUNTER — Ambulatory Visit (INDEPENDENT_AMBULATORY_CARE_PROVIDER_SITE_OTHER): Payer: Managed Care, Other (non HMO) | Admitting: Physician Assistant

## 2014-07-06 ENCOUNTER — Encounter: Payer: Self-pay | Admitting: Physician Assistant

## 2014-07-06 VITALS — Temp 98.6°F | Ht 72.0 in | Wt 245.0 lb

## 2014-07-06 DIAGNOSIS — J029 Acute pharyngitis, unspecified: Secondary | ICD-10-CM

## 2014-07-06 LAB — POCT RAPID STREP A (OFFICE): Rapid Strep A Screen: NEGATIVE

## 2014-07-06 MED ORDER — AMOXICILLIN-POT CLAVULANATE 875-125 MG PO TABS: 1.0000 | ORAL_TABLET | Freq: Two times a day (BID) | ORAL | Status: DC

## 2014-07-06 NOTE — Progress Notes (Signed)
Subjective:     Patient ID: Alex Howell, male   DOB: [birth year], 44 y.o.   MRN: [id number]  Sore Throat  Associated symptoms include congestion and coughing. Pertinent negatives include no shortness of breath.  Cough Associated symptoms include chills and a fever. Pertinent negatives include no postnasal drip, rhinorrhea, shortness of breath or wheezing.  Hypertension Pertinent negatives include no shortness of breath.     Review of Systems  Constitutional: Positive for fever, chills, activity change, appetite change and fatigue.  HENT: Positive for congestion. Negative for nosebleeds, postnasal drip, rhinorrhea and sinus pressure.   Respiratory: Positive for cough. Negative for shortness of breath and wheezing.        Objective:   Physical Exam  Constitutional: He appears well-developed and well-nourished.  HENT:  Right Ear: External ear normal.  Left Ear: External ear normal.  Mouth/Throat: Oropharynx is clear and moist.  Neck: Neck supple.  Cardiovascular: Normal rate, regular rhythm and normal heart sounds.   Pulmonary/Chest: Effort normal and breath sounds normal. He has no wheezes. He has no rales.  Lymphadenopathy:    He has no cervical adenopathy.  Nursing note and vitals reviewed. RST - negative     Assessment:     Acute URI    Plan:     Fluids Rest Pt to restart his BP med but will do 1 po qd Augmentin 875mg  bid #20 with food

## 2014-07-06 NOTE — Patient Instructions (Signed)
Upper Respiratory Infection, Adult An upper respiratory infection (URI) is also sometimes known as the common cold. The upper respiratory tract includes the nose, sinuses, throat, trachea, and bronchi. Bronchi are the airways leading to the lungs. Most people improve within 1 week, but symptoms can last up to 2 weeks. A residual cough may last even longer.  CAUSES Many different viruses can infect the tissues lining the upper respiratory tract. The tissues become irritated and inflamed and often become very moist. Mucus production is also common. A cold is contagious. You can easily spread the virus to others by oral contact. This includes kissing, sharing a glass, coughing, or sneezing. Touching your mouth or nose and then touching a surface, which is then touched by another person, can also spread the virus. SYMPTOMS  Symptoms typically develop 1 to 3 days after you come in contact with a cold virus. Symptoms vary from person to person. They may include:  Runny nose.  Sneezing.  Nasal congestion.  Sinus irritation.  Sore throat.  Loss of voice (laryngitis).  Cough.  Fatigue.  Muscle aches.  Loss of appetite.  Headache.  Low-grade fever. DIAGNOSIS  You might diagnose your own cold based on familiar symptoms, since most people get a cold 2 to 3 times a year. Your caregiver can confirm this based on your exam. Most importantly, your caregiver can check that your symptoms are not due to another disease such as strep throat, sinusitis, pneumonia, asthma, or epiglottitis. Blood tests, throat tests, and X-rays are not necessary to diagnose a common cold, but they may sometimes be helpful in excluding other more serious diseases. Your caregiver will decide if any further tests are required. RISKS AND COMPLICATIONS  You may be at risk for a more severe case of the common cold if you smoke cigarettes, have chronic heart disease (such as heart failure) or lung disease (such as asthma), or if  you have a weakened immune system. The very young and very old are also at risk for more serious infections. Bacterial sinusitis, middle ear infections, and bacterial pneumonia can complicate the common cold. The common cold can worsen asthma and chronic obstructive pulmonary disease (COPD). Sometimes, these complications can require emergency medical care and may be life-threatening. PREVENTION  The best way to protect against getting a cold is to practice good hygiene. Avoid oral or hand contact with people with cold symptoms. Wash your hands often if contact occurs. There is no clear evidence that vitamin C, vitamin E, echinacea, or exercise reduces the chance of developing a cold. However, it is always recommended to get plenty of rest and practice good nutrition. TREATMENT  Treatment is directed at relieving symptoms. There is no cure. Antibiotics are not effective, because the infection is caused by a virus, not by bacteria. Treatment may include:  Increased fluid intake. Sports drinks offer valuable electrolytes, sugars, and fluids.  Breathing heated mist or steam (vaporizer or shower).  Eating chicken soup or other clear broths, and maintaining good nutrition.  Getting plenty of rest.  Using gargles or lozenges for comfort.  Controlling fevers with ibuprofen or acetaminophen as directed by your caregiver.  Increasing usage of your inhaler if you have asthma. Zinc gel and zinc lozenges, taken in the first 24 hours of the common cold, can shorten the duration and lessen the severity of symptoms. Pain medicines may help with fever, muscle aches, and throat pain. A variety of non-prescription medicines are available to treat congestion and runny nose. Your caregiver   can make recommendations and may suggest nasal or lung inhalers for other symptoms.  HOME CARE INSTRUCTIONS   Only take over-the-counter or prescription medicines for pain, discomfort, or fever as directed by your  caregiver.  Use a warm mist humidifier or inhale steam from a shower to increase air moisture. This may keep secretions moist and make it easier to breathe.  Drink enough water and fluids to keep your urine clear or pale yellow.  Rest as needed.  Return to work when your temperature has returned to normal or as your caregiver advises. You may need to stay home longer to avoid infecting others. You can also use a face mask and careful hand washing to prevent spread of the virus. SEEK MEDICAL CARE IF:   After the first few days, you feel you are getting worse rather than better.  You need your caregiver's advice about medicines to control symptoms.  You develop chills, worsening shortness of breath, or brown or red sputum. These may be signs of pneumonia.  You develop yellow or brown nasal discharge or pain in the face, especially when you bend forward. These may be signs of sinusitis.  You develop a fever, swollen neck glands, pain with swallowing, or white areas in the back of your throat. These may be signs of strep throat. SEEK IMMEDIATE MEDICAL CARE IF:   You have a fever.  You develop severe or persistent headache, ear pain, sinus pain, or chest pain.  You develop wheezing, a prolonged cough, cough up blood, or have a change in your usual mucus (if you have chronic lung disease).  You develop sore muscles or a stiff neck. Document Released: 08/02/2000 Document Revised: 05/01/2011 Document Reviewed: 05/14/2013 Exit[employer] Patient Information 2015 [employer]. This information is not intended to replace advice given to you by your health care provider. Make sure you discuss any questions you have with your health care provider.  

## 2014-07-08 ENCOUNTER — Ambulatory Visit (INDEPENDENT_AMBULATORY_CARE_PROVIDER_SITE_OTHER): Payer: Managed Care, Other (non HMO) | Admitting: Family

## 2014-07-08 ENCOUNTER — Telehealth: Payer: Self-pay | Admitting: Family

## 2014-07-08 ENCOUNTER — Encounter: Payer: Self-pay | Admitting: Family

## 2014-07-08 ENCOUNTER — Encounter: Payer: Self-pay | Admitting: *Deleted

## 2014-07-08 VITALS — BP 131/90 | HR 52 | Temp 98.1°F | Ht 72.0 in | Wt 245.4 lb

## 2014-07-08 DIAGNOSIS — J069 Acute upper respiratory infection, unspecified: Secondary | ICD-10-CM

## 2014-07-08 MED ORDER — HYDROCODONE-HOMATROPINE 5-1.5 MG/5ML PO SYRP: 5.0000 mL | ORAL_SOLUTION | Freq: Three times a day (TID) | ORAL | Status: DC | PRN

## 2014-07-08 MED ORDER — BENZONATATE 200 MG PO CAPS: 200.0000 mg | ORAL_CAPSULE | Freq: Three times a day (TID) | ORAL | Status: DC | PRN

## 2014-07-08 NOTE — Progress Notes (Signed)
Subjective:    Patient ID: Alex Howell, male    DOB: October 18, 1970, 44 y.o.   MRN: [id number]  Cough This is a recurrent problem. The current episode started 1 to 4 weeks ago. The problem has been unchanged. The problem occurs every few minutes. The cough is productive of sputum. Associated symptoms include headaches, nasal congestion and rhinorrhea. Pertinent negatives include no ear congestion, ear pain, fever, hemoptysis, postnasal drip or sore throat. The symptoms are aggravated by lying down. He has tried rest (Pt started on antibiotic on Monday) for the symptoms. The treatment provided mild relief. There is no history of asthma or COPD.      Review of Systems  Constitutional: Negative.  Negative for fever.  HENT: Positive for rhinorrhea. Negative for ear pain, postnasal drip and sore throat.   Respiratory: Positive for cough. Negative for hemoptysis.   Cardiovascular: Negative.   Gastrointestinal: Negative.   Endocrine: Negative.   Genitourinary: Negative.   Musculoskeletal: Negative.   Neurological: Positive for headaches.  Hematological: Negative.   Psychiatric/Behavioral: Negative.   All other systems reviewed and are negative.      Objective:   Physical Exam  Constitutional: He is oriented to person, place, and time. He appears well-developed and well-nourished. No distress.  HENT:  Head: Normocephalic.  Right Ear: External ear normal.  Left Ear: External ear normal.  Nasal passage erythemas with mild swelling  Oropharynx erythemas   Eyes: Pupils are equal, round, and reactive to light. Right eye exhibits no discharge. Left eye exhibits no discharge.  Neck: Normal range of motion. Neck supple. No thyromegaly present.  Cardiovascular: Normal rate, regular rhythm, normal heart sounds and intact distal pulses.   No murmur heard. Pulmonary/Chest: Effort normal and breath sounds normal. No respiratory distress. He has no wheezes.  Abdominal: Soft. Bowel sounds are  normal. He exhibits no distension. There is no tenderness.  Musculoskeletal: Normal range of motion. He exhibits no edema or tenderness.  Neurological: He is alert and oriented to person, place, and time. He has normal reflexes. No cranial nerve deficit.  Skin: Skin is warm and dry. No rash noted. No erythema.  Psychiatric: He has a normal mood and affect. His behavior is normal. Judgment and thought content normal.  Vitals reviewed.     BP 131/90 mmHg  Pulse 52  Temp(Src) 98.1 F (36.7 C) (Oral)  Ht 6' (1.829 m)  Wt 245 lb 6.4 oz (111.313 kg)  BMI 33.28 kg/m2     Assessment & Plan:  1. Acute upper respiratory infection -- Take meds as prescribed - Use a cool mist humidifier  -Use saline nose sprays frequently -Saline irrigations of the nose can be very helpful if done frequently.  * 4X daily for 1 week*  * Use of a nettie pot can be helpful with this. Follow directions with this* -Force fluids -For any cough or congestion  Use plain Mucinex- regular strength or max strength is fine   * Children- consult with Pharmacist for dosing -For fever or aces or pains- take tylenol or ibuprofen appropriate for age and weight.  * for fevers greater than 101 orally you may alternate ibuprofen and tylenol every  3 hours. -Throat lozenges if helps - benzonatate (TESSALON) 200 MG capsule; Take 1 capsule (200 mg total) by mouth 3 (three) times daily as needed.  Dispense: 30 capsule; Refill: 1 - HYDROcodone-homatropine (HYCODAN) 5-1.5 MG/5ML syrup; Take 5 mLs by mouth every 8 (eight) hours as needed for cough.  Dispense:  120 mL; Refill: 0  Jannifer Rodneyhristy Ryosuke Ericksen, FNP

## 2014-07-08 NOTE — Telephone Encounter (Signed)
Pt aware note was written to return to work on 5/19, pt works 3rd shift

## 2014-07-08 NOTE — Patient Instructions (Signed)
Upper Respiratory Infection, Adult An upper respiratory infection (URI) is also sometimes known as the common cold. The upper respiratory tract includes the nose, sinuses, throat, trachea, and bronchi. Bronchi are the airways leading to the lungs. Most people improve within 1 week, but symptoms can last up to 2 weeks. A residual cough may last even longer.  CAUSES Many different viruses can infect the tissues lining the upper respiratory tract. The tissues become irritated and inflamed and often become very moist. Mucus production is also common. A cold is contagious. You can easily spread the virus to others by oral contact. This includes kissing, sharing a glass, coughing, or sneezing. Touching your mouth or nose and then touching a surface, which is then touched by another person, can also spread the virus. SYMPTOMS  Symptoms typically develop 1 to 3 days after you come in contact with a cold virus. Symptoms vary from person to person. They may include:  Runny nose.  Sneezing.  Nasal congestion.  Sinus irritation.  Sore throat.  Loss of voice (laryngitis).  Cough.  Fatigue.  Muscle aches.  Loss of appetite.  Headache.  Low-grade fever. DIAGNOSIS  You might diagnose your own cold based on familiar symptoms, since most people get a cold 2 to 3 times a year. Your caregiver can confirm this based on your exam. Most importantly, your caregiver can check that your symptoms are not due to another disease such as strep throat, sinusitis, pneumonia, asthma, or epiglottitis. Blood tests, throat tests, and X-rays are not necessary to diagnose a common cold, but they may sometimes be helpful in excluding other more serious diseases. Your caregiver will decide if any further tests are required. RISKS AND COMPLICATIONS  You may be at risk for a more severe case of the common cold if you smoke cigarettes, have chronic heart disease (such as heart failure) or lung disease (such as asthma), or if  you have a weakened immune system. The very young and very old are also at risk for more serious infections. Bacterial sinusitis, middle ear infections, and bacterial pneumonia can complicate the common cold. The common cold can worsen asthma and chronic obstructive pulmonary disease (COPD). Sometimes, these complications can require emergency medical care and may be life-threatening. PREVENTION  The best way to protect against getting a cold is to practice good hygiene. Avoid oral or hand contact with people with cold symptoms. Wash your hands often if contact occurs. There is no clear evidence that vitamin C, vitamin E, echinacea, or exercise reduces the chance of developing a cold. However, it is always recommended to get plenty of rest and practice good nutrition. TREATMENT  Treatment is directed at relieving symptoms. There is no cure. Antibiotics are not effective, because the infection is caused by a virus, not by bacteria. Treatment may include:  Increased fluid intake. Sports drinks offer valuable electrolytes, sugars, and fluids.  Breathing heated mist or steam (vaporizer or shower).  Eating chicken soup or other clear broths, and maintaining good nutrition.  Getting plenty of rest.  Using gargles or lozenges for comfort.  Controlling fevers with ibuprofen or acetaminophen as directed by your caregiver.  Increasing usage of your inhaler if you have asthma. Zinc gel and zinc lozenges, taken in the first 24 hours of the common cold, can shorten the duration and lessen the severity of symptoms. Pain medicines may help with fever, muscle aches, and throat pain. A variety of non-prescription medicines are available to treat congestion and runny nose. Your caregiver   can make recommendations and may suggest nasal or lung inhalers for other symptoms.  HOME CARE INSTRUCTIONS   Only take over-the-counter or prescription medicines for pain, discomfort, or fever as directed by your  caregiver.  Use a warm mist humidifier or inhale steam from a shower to increase air moisture. This may keep secretions moist and make it easier to breathe.  Drink enough water and fluids to keep your urine clear or pale yellow.  Rest as needed.  Return to work when your temperature has returned to normal or as your caregiver advises. You may need to stay home longer to avoid infecting others. You can also use a face mask and careful hand washing to prevent spread of the virus. SEEK MEDICAL CARE IF:   After the first few days, you feel you are getting worse rather than better.  You need your caregiver's advice about medicines to control symptoms.  You develop chills, worsening shortness of breath, or brown or red sputum. These may be signs of pneumonia.  You develop yellow or brown nasal discharge or pain in the face, especially when you bend forward. These may be signs of sinusitis.  You develop a fever, swollen neck glands, pain with swallowing, or white areas in the back of your throat. These may be signs of strep throat. SEEK IMMEDIATE MEDICAL CARE IF:   You have a fever.  You develop severe or persistent headache, ear pain, sinus pain, or chest pain.  You develop wheezing, a prolonged cough, cough up blood, or have a change in your usual mucus (if you have chronic lung disease).  You develop sore muscles or a stiff neck. Document Released: 08/02/2000 Document Revised: 05/01/2011 Document Reviewed: 05/14/2013 [employer] Patient Information 2015 [employer]. This information is not intended to replace advice given to you by your health care provider. Make sure you discuss any questions you have with your health care provider.  - Take meds as prescribed - Use a cool mist humidifier  -Use saline nose sprays frequently -Saline irrigations of the nose can be very helpful if done frequently.  * 4X daily for 1 week*  * Use of a nettie pot can be helpful with this. Follow  directions with this* -Force fluids -For any cough or congestion  Use plain Mucinex- regular strength or max strength is fine   * Children- consult with Pharmacist for dosing -For fever or aces or pains- take tylenol or ibuprofen appropriate for age and weight.  * for fevers greater than 101 orally you may alternate ibuprofen and tylenol every  3 hours. -Throat lozenges if help   Alex Krider, FNP   

## 2014-07-09 ENCOUNTER — Encounter: Payer: Self-pay | Admitting: Nurse Practitioner

## 2014-07-09 ENCOUNTER — Ambulatory Visit (INDEPENDENT_AMBULATORY_CARE_PROVIDER_SITE_OTHER): Payer: Managed Care, Other (non HMO) | Admitting: Nurse Practitioner

## 2014-07-09 VITALS — BP 145/90 | HR 51 | Temp 97.0°F | Ht 72.0 in | Wt 248.0 lb

## 2014-07-09 DIAGNOSIS — R112 Nausea with vomiting, unspecified: Secondary | ICD-10-CM

## 2014-07-09 MED ORDER — ONDANSETRON HCL 4 MG PO TABS: 4.0000 mg | ORAL_TABLET | Freq: Three times a day (TID) | ORAL | Status: DC | PRN

## 2014-07-09 NOTE — Progress Notes (Signed)
   Subjective:    Patient ID: Alex Howell, male    DOB: 04/10/1970, 44 y.o.   MRN: [id number]  HPI Patient was seen Monday by B. Webster and was given augmentin for URI-  Was seen again yesterday by C.Hawks and she gave him some cough medicine ( Hycodan and benzonate). STarting throwing up this morning after taking cough meds. Denies fever.    Review of Systems  Constitutional: Negative.   HENT: Negative.   Respiratory: Negative.   Cardiovascular: Negative.   Gastrointestinal: Negative.   Genitourinary: Negative.   Neurological: Negative.   Psychiatric/Behavioral: Negative.   All other systems reviewed and are negative.      Objective:   Physical Exam  Constitutional: He is oriented to person, place, and time. He appears well-developed and well-nourished.  HENT:  Right Ear: External ear normal.  Left Ear: External ear normal.  Nose: Nose normal.  Mouth/Throat: Oropharynx is clear and moist.  Cardiovascular: Normal rate, regular rhythm and normal heart sounds.   Pulmonary/Chest: Effort normal and breath sounds normal.  Abdominal: Soft. Bowel sounds are normal.  hyperactive bowel sounds  Neurological: He is alert and oriented to person, place, and time.  Skin: Skin is warm and dry.  Psychiatric: He has a normal mood and affect. His behavior is normal. Judgment and thought content normal.    BP 145/90 mmHg  Pulse 51  Temp(Src) 97 F (36.1 C) (Oral)  Ht 6' (1.829 m)  Wt 248 lb (112.492 kg)  BMI 33.63 kg/m2       Assessment & Plan:  1. Non-intractable vomiting with nausea, vomiting of unspecified type Could be stomach virus or reaction to codeine cough medicine Meds ordered this encounter  Medications  . ondansetron (ZOFRAN) 4 MG tablet    Sig: Take 1 tablet (4 mg total) by mouth every 8 (eight) hours as needed for nausea or vomiting.    Dispense:  20 tablet    Refill:  0    Order Specific Question:  Supervising Provider    Answer:  Ernestina PennaMOORE, DONALD W [[id number]]    First 24 Hours-Clear liquids  popsicles  Jello  gatorade  Sprite Second 24 hours-Add Full liquids ( Liquids you cant see through) Third 24 hours- Bland diet ( foods that are baked or broiled)  *avoiding fried foods and highly spiced foods* During these 3 days  Avoid milk, cheese, ice cream or any other dairy products  Avoid caffeine- REMEMBER Mt. Dew and Mello Yellow contain lots of caffeine You should eat and drink in  Frequent small volumes If no improvement in symptoms or worsen in 2-3 days should RETRUN TO OFFICE or go to ER!    RTO prn   Mary-Margaret Daphine DeutscherMartin, FNP

## 2014-07-24 ENCOUNTER — Other Ambulatory Visit: Payer: Self-pay | Admitting: Physician Assistant

## 2014-10-14 ENCOUNTER — Ambulatory Visit: Payer: Managed Care, Other (non HMO) | Admitting: Family Medicine

## 2014-10-15 ENCOUNTER — Encounter: Payer: Self-pay | Admitting: Family Medicine

## 2014-10-15 ENCOUNTER — Ambulatory Visit (INDEPENDENT_AMBULATORY_CARE_PROVIDER_SITE_OTHER): Payer: Managed Care, Other (non HMO) | Admitting: Family Medicine

## 2014-10-15 VITALS — BP 135/92 | HR 78 | Temp 97.0°F | Ht 72.0 in | Wt 254.8 lb

## 2014-10-15 DIAGNOSIS — E785 Hyperlipidemia, unspecified: Secondary | ICD-10-CM

## 2014-10-15 DIAGNOSIS — I10 Essential (primary) hypertension: Secondary | ICD-10-CM | POA: Diagnosis not present

## 2014-10-15 MED ORDER — IRBESARTAN-HYDROCHLOROTHIAZIDE 150-12.5 MG PO TABS: 1.0000 | ORAL_TABLET | Freq: Every day | ORAL | Status: DC

## 2014-10-15 NOTE — Assessment & Plan Note (Addendum)
Cough with lisinopril. Switch to ARB because does not tolerate ace inhibitors. Was on Benicar before but insurance did not pay for it we'll try Avalide. Gave coupon as well. We'll check labs.

## 2014-10-15 NOTE — Progress Notes (Signed)
BP 135/92 mmHg  Pulse 78  Temp(Src) 97 F (36.1 C) (Oral)  Ht 6' (1.829 m)  Wt 254 lb 12.8 oz (115.577 kg)  BMI 34.55 kg/m2   Subjective:    Patient ID: Alex Howell, male    DOB: 08/18/1970, 44 y.o.   MRN: [id number]  HPI: Alex Howell is a 44 y.o. male presenting on 10/15/2014 for Medication Refill   HPI Hypertension Patient comes in today for hypertension recheck. His insurance denied his Benicar which used to be on and it was switched to lisinopril hydrochlorothiazide after which he developed a cough and was unable to tolerate. His blood pressure today is 135/92. Patient denies headaches, blurred vision, chest pains, shortness of breath, or weakness. Denies any side effects from medication and is content with current medication.  Hyperlipidemia Patient has not has cholesterol rechecked for sometime, he is not a medications for hyperlipidemia currently but does have the diagnosis of it in his chart. It has been elevated previously slightly, ready to recheck those labs.  Relevant past medical, surgical, family and social history reviewed and updated as indicated. Interim medical history since our last visit reviewed. Allergies and medications reviewed and updated.  Review of Systems  Constitutional: Negative for fever and chills.  HENT: Negative for ear discharge and ear pain.   Eyes: Negative for discharge and visual disturbance.  Respiratory: Negative for shortness of breath and wheezing.   Cardiovascular: Negative for chest pain and leg swelling.  Gastrointestinal: Negative for abdominal pain, diarrhea and constipation.  Genitourinary: Negative for difficulty urinating.  Musculoskeletal: Negative for back pain and gait problem.  Skin: Negative for rash.  Neurological: Negative for dizziness, syncope, weakness, light-headedness, numbness and headaches.  All other systems reviewed and are negative.   Per HPI unless specifically indicated above     Medication  List       This list is accurate as of: 10/15/14  9:50 AM.  Always use your most recent med list.               irbesartan-hydrochlorothiazide 150-12.5 MG per tablet  Commonly known as:  AVALIDE  Take 1 tablet by mouth daily.     vitamin E 100 UNIT capsule  Take by mouth daily.           Objective:    BP 135/92 mmHg  Pulse 78  Temp(Src) 97 F (36.1 C) (Oral)  Ht 6' (1.829 m)  Wt 254 lb 12.8 oz (115.577 kg)  BMI 34.55 kg/m2  Wt Readings from Last 3 Encounters:  10/15/14 254 lb 12.8 oz (115.577 kg)  07/09/14 248 lb (112.492 kg)  07/08/14 245 lb 6.4 oz (111.313 kg)    Physical Exam  Constitutional: He is oriented to person, place, and time. He appears well-developed and well-nourished. No distress.  Eyes: Conjunctivae and EOM are normal. Pupils are equal, round, and reactive to light. Right eye exhibits no discharge. No scleral icterus.  Cardiovascular: Normal rate, regular rhythm, normal heart sounds and intact distal pulses.   No murmur heard. Pulmonary/Chest: Effort normal and breath sounds normal. No respiratory distress. He has no wheezes.  Abdominal: He exhibits no distension.  Musculoskeletal: Normal range of motion. He exhibits no edema.  Neurological: He is alert and oriented to person, place, and time. No cranial nerve deficit. Coordination normal.  Skin: Skin is warm and dry. No rash noted. He is not diaphoretic.  Psychiatric: He has a normal mood and affect. His behavior is normal.  Vitals  reviewed.   Results for orders placed or performed in visit on 07/06/14  POCT rapid strep A  Result Value Ref Range   Rapid Strep A Screen Negative Negative      Assessment & Plan:   Problem List Items Addressed This Visit      Cardiovascular and Mediastinum   HTN, goal below 140/90 - Primary    Cough with lisinopril. Switch to ARB because does not tolerate ace inhibitors. Was on Benicar before but insurance did not pay for it we'll try Avalide. Gave coupon as  well. We'll check labs.      Relevant Medications   irbesartan-hydrochlorothiazide (AVALIDE) 150-12.5 MG per tablet   Other Relevant Orders   BMP8+EGFR   Lipid panel     Other   Dyslipidemia    Patient has not had cholesterol checked for sometime. We'll check labs.      Relevant Orders   Lipid panel       Follow up plan: Return in about 3 months (around 01/15/2015), or if symptoms worsen or fail to improve.  Caryl Pina, MD [facility] 10/15/2014, 9:50 AM

## 2014-10-15 NOTE — Patient Instructions (Signed)

## 2014-10-15 NOTE — Assessment & Plan Note (Signed)
Patient has not had cholesterol checked for sometime. We'll check labs.

## 2014-12-14 ENCOUNTER — Encounter: Payer: Self-pay | Admitting: Family Medicine

## 2014-12-14 ENCOUNTER — Ambulatory Visit (INDEPENDENT_AMBULATORY_CARE_PROVIDER_SITE_OTHER): Payer: Managed Care, Other (non HMO) | Admitting: Family Medicine

## 2014-12-14 VITALS — BP 124/77 | HR 53 | Temp 97.8°F | Ht 72.0 in | Wt 254.2 lb

## 2014-12-14 DIAGNOSIS — I10 Essential (primary) hypertension: Secondary | ICD-10-CM

## 2014-12-14 MED ORDER — OLMESARTAN MEDOXOMIL-HCTZ 20-12.5 MG PO TABS: 1.0000 | ORAL_TABLET | Freq: Every day | ORAL | Status: DC

## 2014-12-14 NOTE — Assessment & Plan Note (Signed)
We'll try to switch to Benicar and see if it is covered by insurance. Patient will return in 6 months. Patient will do labs today. He will return sooner if blood pressures are elevated over 140/90 or if he has any further issues with getting the medication.

## 2014-12-14 NOTE — Progress Notes (Signed)
BP 124/77 mmHg  Pulse 53  Temp(Src) 97.8 F (36.6 C) (Oral)  Ht 6' (1.829 m)  Wt 254 lb 3.2 oz (115.304 kg)  BMI 34.47 kg/m2   Subjective:    Patient ID: Alex Howell, male    DOB: 1970-05-23, 44 y.o.   MRN: [id number]  HPI: Alex Howell is a 44 y.o. male presenting on 12/14/2014 for Hypertension   HPI Hypertension Patient presents today for hypertension recheck. He was switched from lisinopril to Avalide because of the cough. He feels like he still has a little bit of cough left and had no cough when he was on the Benicar before. He would like to go back on the Benicar if possible. Patient denies headaches, blurred vision, chest pains, shortness of breath, or weakness. Denies any side effects from medication and is content with current medication.  Relevant past medical, surgical, family and social history reviewed and updated as indicated. Interim medical history since our last visit reviewed. Allergies and medications reviewed and updated.  Review of Systems  Constitutional: Negative for fever.  HENT: Negative for ear discharge and ear pain.   Eyes: Negative for discharge and visual disturbance.  Respiratory: Negative for shortness of breath and wheezing.   Cardiovascular: Negative for chest pain and leg swelling.  Gastrointestinal: Negative for abdominal pain, diarrhea and constipation.  Genitourinary: Negative for difficulty urinating.  Musculoskeletal: Negative for back pain and gait problem.  Skin: Negative for rash.  Neurological: Negative for dizziness, syncope, light-headedness and headaches.  All other systems reviewed and are negative.   Per HPI unless specifically indicated above     Medication List       This list is accurate as of: 12/14/14  8:23 AM.  Always use your most recent med list.               olmesartan-hydrochlorothiazide 20-12.5 MG tablet  Commonly known as:  BENICAR HCT  Take 1 tablet by mouth daily.     vitamin E 100 UNIT  capsule  Take by mouth daily.           Objective:    BP 124/77 mmHg  Pulse 53  Temp(Src) 97.8 F (36.6 C) (Oral)  Ht 6' (1.829 m)  Wt 254 lb 3.2 oz (115.304 kg)  BMI 34.47 kg/m2  Wt Readings from Last 3 Encounters:  12/14/14 254 lb 3.2 oz (115.304 kg)  10/15/14 254 lb 12.8 oz (115.577 kg)  07/09/14 248 lb (112.492 kg)    Physical Exam  Constitutional: He is oriented to person, place, and time. He appears well-developed and well-nourished. No distress.  Eyes: Conjunctivae and EOM are normal. Pupils are equal, round, and reactive to light. Right eye exhibits no discharge. No scleral icterus.  Cardiovascular: Normal rate, regular rhythm, normal heart sounds and intact distal pulses.   No murmur heard. Pulmonary/Chest: Effort normal and breath sounds normal. No respiratory distress. He has no wheezes.  Musculoskeletal: Normal range of motion. He exhibits no edema.  Neurological: He is alert and oriented to person, place, and time. Coordination normal.  Skin: Skin is warm and dry. No rash noted. He is not diaphoretic.  Psychiatric: He has a normal mood and affect. His behavior is normal.  Vitals reviewed.   Results for orders placed or performed in visit on 07/06/14  POCT rapid strep A  Result Value Ref Range   Rapid Strep A Screen Negative Negative      Assessment & Plan:   Problem List Items  Addressed This Visit      Cardiovascular and Mediastinum   HTN, goal below 140/90 - Primary    We'll try to switch to Benicar and see if it is covered by insurance. Patient will return in 6 months. Patient will do labs today. He will return sooner if blood pressures are elevated over 140/90 or if he has any further issues with getting the medication.      Relevant Medications   olmesartan-hydrochlorothiazide (BENICAR HCT) 20-12.5 MG tablet   Other Relevant Orders   CMP14+EGFR   Lipid panel       Follow up plan: Return in about 6 months (around 06/14/2015), or if symptoms  worsen or fail to improve, for Hypertension.  Caryl Pina, MD [facility] 12/14/2014, 8:23 AM

## 2014-12-15 LAB — CMP14+EGFR
A/G RATIO: 1.4 (ref 1.1–2.5)
ALK PHOS: 80 IU/L (ref 39–117)
ALT: 19 IU/L (ref 0–44)
AST: 28 IU/L (ref 0–40)
Albumin: 4.4 g/dL (ref 3.5–5.5)
BUN/Creatinine Ratio: 16 (ref 9–20)
BUN: 14 mg/dL (ref 6–24)
Bilirubin Total: 0.3 mg/dL (ref 0.0–1.2)
CO2: 22 mmol/L (ref 18–29)
Calcium: 9.4 mg/dL (ref 8.7–10.2)
Chloride: 101 mmol/L (ref 97–106)
Creatinine, Ser: 0.87 mg/dL (ref 0.76–1.27)
GFR calc Af Amer: 120 mL/min/{1.73_m2} (ref >59)
GFR, EST NON AFRICAN AMERICAN: 104 mL/min/{1.73_m2} (ref >59)
GLOBULIN, TOTAL: 3.1 g/dL (ref 1.5–4.5)
Glucose: 93 mg/dL (ref 65–99)
POTASSIUM: 4.3 mmol/L (ref 3.5–5.2)
SODIUM: 141 mmol/L (ref 136–144)
Total Protein: 7.5 g/dL (ref 6.0–8.5)

## 2014-12-15 LAB — LIPID PANEL
CHOL/HDL RATIO: 4.5 ratio (ref 0.0–5.0)
CHOLESTEROL TOTAL: 187 mg/dL (ref 100–199)
HDL: 42 mg/dL (ref >39)
LDL Calculated: 129 mg/dL — ABNORMAL HIGH (ref 0–99)
TRIGLYCERIDES: 82 mg/dL (ref 0–149)
VLDL Cholesterol Cal: 16 mg/dL (ref 5–40)

## 2014-12-16 NOTE — Progress Notes (Signed)
Patient informed and voices understanding 

## 2015-01-25 ENCOUNTER — Ambulatory Visit: Payer: Self-pay | Admitting: Family Medicine

## 2015-01-26 ENCOUNTER — Encounter: Payer: Self-pay | Admitting: Family Medicine

## 2015-08-17 ENCOUNTER — Encounter: Payer: Self-pay | Admitting: Family Medicine

## 2015-08-17 ENCOUNTER — Ambulatory Visit (INDEPENDENT_AMBULATORY_CARE_PROVIDER_SITE_OTHER): Payer: Managed Care, Other (non HMO) | Admitting: Family Medicine

## 2015-08-17 VITALS — BP 142/90 | HR 52 | Temp 97.5°F | Ht 73.83 in | Wt 258.8 lb

## 2015-08-17 DIAGNOSIS — R21 Rash and other nonspecific skin eruption: Secondary | ICD-10-CM

## 2015-08-17 MED ORDER — CEPHALEXIN 500 MG PO CAPS: 500.0000 mg | ORAL_CAPSULE | Freq: Three times a day (TID) | ORAL | Status: DC

## 2015-08-17 NOTE — Progress Notes (Signed)
   HPI  Patient presents today here with rash.  Patient's lines that over the last 2-3 days he's had a slowly worsening right upper thigh rash. He states that it's tender to the touch and spreading slightly but overall not bothering him.  He denies any fever, chills, sweats.  He's tolerating food and fluids normally.  He states that he had a tick bite on the same lower extremity on his right lateral ankle about 1-1/2 weeks ago which has resolved easily. He has no joint pains, arthralgias, or other rashes.  PMH: Smoking status noted ROS: Per HPI  Objective: BP 142/90 mmHg  Pulse 52  Temp(Src) 97.5 F (36.4 C) (Oral)  Ht 6' 1.83" (1.875 m)  Wt 258 lb 12.8 oz (117.391 kg)  BMI 33.39 kg/m2 Gen: NAD, alert, cooperative with exam HEENT: NCAT CV: RRR, good S1/S2, no murmur Resp: CTABL, no wheezes, non-labored Ext: No edema, warm Neuro: Alert and oriented, No gross deficits  Skin: 2 areas that are roughly the same shape and size measuring 9 cm x 5 cm with patchy erythema and a few small red papules within, no areas of fluctuance or induration concerning for abscess. Both located on the right upper medial thigh  Assessment and plan:  # Rash Suspect impetigo versus cellulitis Covered with Keflex Discussed usual course of illness. Return to clinic with any worsening, failure to improve, or new concerns.   Tick bite appears well healed, I doubt that this is related to the tick  Meds ordered this encounter  Medications  . cephALEXin (KEFLEX) 500 MG capsule    Sig: Take 1 capsule (500 mg total) by mouth 3 (three) times daily.    Dispense:  21 capsule    Refill:  0    [name], MD [facility] 08/17/2015, 9:12 AM

## 2015-08-17 NOTE — Patient Instructions (Signed)
Great to meet you!  I am treating you for an infection with keflex, an antibiotic  Please come back if you are not getting better as expected  Cellulitis Cellulitis is an infection of the skin and the tissue beneath it. The infected area is usually red and tender. Cellulitis occurs most often in the arms and lower legs.  CAUSES  Cellulitis is caused by bacteria that enter the skin through cracks or cuts in the skin. The most common types of bacteria that cause cellulitis are staphylococci and streptococci. SIGNS AND SYMPTOMS   Redness and warmth.  Swelling.  Tenderness or pain.  Fever. DIAGNOSIS  Your health care provider can usually determine what is wrong based on a physical exam. Blood tests may also be done. TREATMENT  Treatment usually involves taking an antibiotic medicine. HOME CARE INSTRUCTIONS   Take your antibiotic medicine as directed by your health care provider. Finish the antibiotic even if you start to feel better.  Keep the infected arm or leg elevated to reduce swelling.  Apply a warm cloth to the affected area up to 4 times per day to relieve pain.  Take medicines only as directed by your health care provider.  Keep all follow-up visits as directed by your health care provider. SEEK MEDICAL CARE IF:   You notice red streaks coming from the infected area.  Your red area gets larger or turns dark in color.  Your bone or joint underneath the infected area becomes painful after the skin has healed.  Your infection returns in the same area or another area.  You notice a swollen bump in the infected area.  You develop new symptoms.  You have a fever. SEEK IMMEDIATE MEDICAL CARE IF:   You feel very sleepy.  You develop vomiting or diarrhea.  You have a general ill feeling (malaise) with muscle aches and pains.   This information is not intended to replace advice given to you by your health care provider. Make sure you discuss any questions you have  with your health care provider.   Document Released: 11/16/2004 Document Revised: 10/28/2014 Document Reviewed: 04/24/2011 Elsevier Interactive Patient Education [employer]2016 [employer] Inc.

## 2015-12-18 ENCOUNTER — Other Ambulatory Visit: Payer: Self-pay | Admitting: Family Medicine

## 2015-12-18 DIAGNOSIS — I10 Essential (primary) hypertension: Secondary | ICD-10-CM

## 2016-01-17 ENCOUNTER — Other Ambulatory Visit: Payer: Self-pay | Admitting: Family Medicine

## 2016-01-17 DIAGNOSIS — I10 Essential (primary) hypertension: Secondary | ICD-10-CM

## 2016-03-20 ENCOUNTER — Other Ambulatory Visit: Payer: Self-pay | Admitting: Family Medicine

## 2016-03-20 DIAGNOSIS — I10 Essential (primary) hypertension: Secondary | ICD-10-CM

## 2016-03-21 ENCOUNTER — Other Ambulatory Visit: Payer: Self-pay

## 2016-03-21 DIAGNOSIS — I10 Essential (primary) hypertension: Secondary | ICD-10-CM

## 2016-03-21 NOTE — Telephone Encounter (Signed)
Last seen 12/14/14. Please advise

## 2016-03-21 NOTE — Telephone Encounter (Signed)
Pt aware. Appt made for 04/03/16

## 2016-04-03 ENCOUNTER — Ambulatory Visit (INDEPENDENT_AMBULATORY_CARE_PROVIDER_SITE_OTHER): Payer: Managed Care, Other (non HMO) | Admitting: Family Medicine

## 2016-04-03 ENCOUNTER — Encounter: Payer: Self-pay | Admitting: Family Medicine

## 2016-04-03 VITALS — BP 119/77 | HR 48 | Temp 97.2°F | Ht 73.83 in | Wt 262.0 lb

## 2016-04-03 DIAGNOSIS — R001 Bradycardia, unspecified: Secondary | ICD-10-CM | POA: Diagnosis not present

## 2016-04-03 DIAGNOSIS — E669 Obesity, unspecified: Secondary | ICD-10-CM | POA: Diagnosis not present

## 2016-04-03 DIAGNOSIS — I10 Essential (primary) hypertension: Secondary | ICD-10-CM

## 2016-04-03 MED ORDER — OLMESARTAN MEDOXOMIL-HCTZ 20-12.5 MG PO TABS: 1.0000 | ORAL_TABLET | Freq: Every day | ORAL | 3 refills | Status: DC

## 2016-04-03 NOTE — Progress Notes (Signed)
   HPI  Patient presents today for follow-up hypertension.  Patient has no complaints today and feels well.  He has started working out about 2 months ago. He states that he is exercising about 3-4 times a week, spending 30 minutes on a treadmill. He is also watching his diet, he's lost a pound and is happy with his success.  Hypertension Generally in the 619J systolic at home No chest pain, dyspnea, palpitations, leg edema, headaches.  Bradycardia Asymptomatic No reports of palpitations or extra beats. No history of abnormal heartbeat  PMH: Smoking status noted ROS: Per HPI  Objective: BP 119/77   Pulse (!) 48   Temp 97.2 F (36.2 C) (Oral)   Ht 6' 1.83" (1.875 m)   Wt 262 lb (118.8 kg)   BMI 33.79 kg/m  Gen: NAD, alert, cooperative with exam HEENT: NCAT, EOMI, PERRL CV: no murmur, brady, some irregular or extra beats Resp: CTABL, no wheezes, non-labored Ext: No edema, warm Neuro: Alert and oriented, No gross deficits  Assessment and plan:  # HTN Well controlled on benicar HCT Labs, no changes  # Obesity Congratulated on his success  Continue  # Bradycardia EKG with sinus brady, no heart blocks Consider this athletic heart with consistent change, monitor.      Orders Placed This Encounter  Procedures  . Lipid panel  . CBC with Differential/Platelet  . CMP14+EGFR  . TSH  . EKG 12-Lead    Meds ordered this encounter  Medications  . olmesartan-hydrochlorothiazide (BENICAR HCT) 20-12.5 MG tablet    Sig: Take 1 tablet by mouth daily.    Dispense:  90 tablet    Refill:  [place], MD [facility] 04/03/2016, 8:32 AM

## 2016-04-03 NOTE — Patient Instructions (Signed)
Great to see you!  You have done great with your exercise and diet, keep it up!

## 2016-04-04 LAB — CBC WITH DIFFERENTIAL/PLATELET
BASOS ABS: 0 10*3/uL (ref 0.0–0.2)
BASOS: 1 %
EOS (ABSOLUTE): 0.1 10*3/uL (ref 0.0–0.4)
EOS: 2 %
HEMATOCRIT: 39.7 % (ref 37.5–51.0)
HEMOGLOBIN: 13.3 g/dL (ref 13.0–17.7)
IMMATURE GRANS (ABS): 0 10*3/uL (ref 0.0–0.1)
Immature Granulocytes: 0 %
Lymphocytes Absolute: 2.3 10*3/uL (ref 0.7–3.1)
Lymphs: 27 %
MCH: 28.6 pg (ref 26.6–33.0)
MCHC: 33.5 g/dL (ref 31.5–35.7)
MCV: 85 fL (ref 79–97)
MONOCYTES: 4 %
Monocytes Absolute: 0.4 10*3/uL (ref 0.1–0.9)
NEUTROS ABS: 5.5 10*3/uL (ref 1.4–7.0)
Neutrophils: 66 %
Platelets: 355 10*3/uL (ref 150–379)
RBC: 4.65 x10E6/uL (ref 4.14–5.80)
RDW: 13.9 % (ref 12.3–15.4)
WBC: 8.4 10*3/uL (ref 3.4–10.8)

## 2016-04-04 LAB — CMP14+EGFR
ALBUMIN: 4.4 g/dL (ref 3.5–5.5)
ALT: 22 IU/L (ref 0–44)
AST: 21 IU/L (ref 0–40)
Albumin/Globulin Ratio: 1.4 (ref 1.2–2.2)
Alkaline Phosphatase: 75 IU/L (ref 39–117)
BILIRUBIN TOTAL: 0.3 mg/dL (ref 0.0–1.2)
BUN / CREAT RATIO: 14 (ref 9–20)
BUN: 14 mg/dL (ref 6–24)
CHLORIDE: 99 mmol/L (ref 96–106)
CO2: 24 mmol/L (ref 18–29)
Calcium: 9.5 mg/dL (ref 8.7–10.2)
Creatinine, Ser: 0.97 mg/dL (ref 0.76–1.27)
GFR calc non Af Amer: 93 mL/min/{1.73_m2} (ref >59)
GFR, EST AFRICAN AMERICAN: 108 mL/min/{1.73_m2} (ref >59)
GLOBULIN, TOTAL: 3.1 g/dL (ref 1.5–4.5)
GLUCOSE: 95 mg/dL (ref 65–99)
Potassium: 4.4 mmol/L (ref 3.5–5.2)
SODIUM: 139 mmol/L (ref 134–144)
TOTAL PROTEIN: 7.5 g/dL (ref 6.0–8.5)

## 2016-04-04 LAB — LIPID PANEL
CHOLESTEROL TOTAL: 179 mg/dL (ref 100–199)
Chol/HDL Ratio: 5 ratio units (ref 0.0–5.0)
HDL: 36 mg/dL — AB (ref >39)
LDL Calculated: 126 mg/dL — ABNORMAL HIGH (ref 0–99)
TRIGLYCERIDES: 84 mg/dL (ref 0–149)
VLDL Cholesterol Cal: 17 mg/dL (ref 5–40)

## 2016-04-04 LAB — TSH: TSH: 0.702 u[IU]/mL (ref 0.450–4.500)

## 2016-11-17 ENCOUNTER — Ambulatory Visit (INDEPENDENT_AMBULATORY_CARE_PROVIDER_SITE_OTHER): Payer: Managed Care, Other (non HMO) | Admitting: Family Medicine

## 2016-11-17 ENCOUNTER — Encounter (INDEPENDENT_AMBULATORY_CARE_PROVIDER_SITE_OTHER): Payer: Self-pay

## 2016-11-17 ENCOUNTER — Encounter: Payer: Self-pay | Admitting: Family Medicine

## 2016-11-17 VITALS — BP 133/87 | HR 58 | Temp 98.2°F | Ht 73.8 in | Wt 261.0 lb

## 2016-11-17 DIAGNOSIS — M545 Low back pain, unspecified: Secondary | ICD-10-CM

## 2016-11-17 MED ORDER — MELOXICAM 15 MG PO TABS: 15.0000 mg | ORAL_TABLET | Freq: Every day | ORAL | 1 refills | Status: DC

## 2016-11-17 NOTE — Progress Notes (Signed)
   BP 133/87   Pulse (!) 58   Temp 98.2 F (36.8 C) (Oral)   Ht 6' 1.8" (1.875 m)   Wt 261 lb (118.4 kg)   BMI 33.69 kg/m    Subjective:    Patient ID: Alex Howell, male    DOB: 1970-08-09, 46 y.o.   MRN: [id number]  HPI: TAIWO Howell is a 47 y.o. male presenting on 11/17/2016 for Back Pain (lower right side, radiates into hip; ongoing for 4 weeks)   HPI Right lower back pain Patient comes in today complaining of right lower back pain that hurts and goes down into the right lateral aspect of his hip but not down his leg. He says this is been going on for about a month and he feels like he might have strained it or hold it doing something but he cannot recall the specific incident. He denies any fevers or chills or urinary problems. He denies any numbness or weakness. He rates the pain as mild and more nagging aching. He has not used anything for it yet. He says it's worse with prolonged sitting and better with walking.  Relevant past medical, surgical, family and social history reviewed and updated as indicated. Interim medical history since our last visit reviewed. Allergies and medications reviewed and updated.  Review of Systems  Constitutional: Negative for chills and fever.  Respiratory: Negative for shortness of breath and wheezing.   Cardiovascular: Negative for chest pain and leg swelling.  Musculoskeletal: Positive for back pain and myalgias. Negative for gait problem.  Skin: Negative for rash.  Neurological: Negative for weakness and numbness.  All other systems reviewed and are negative.   Per HPI unless specifically indicated above        Objective:    BP 133/87   Pulse (!) 58   Temp 98.2 F (36.8 C) (Oral)   Ht 6' 1.8" (1.875 m)   Wt 261 lb (118.4 kg)   BMI 33.69 kg/m   Wt Readings from Last 3 Encounters:  11/17/16 261 lb (118.4 kg)  04/03/16 262 lb (118.8 kg)  08/17/15 258 lb 12.8 oz (117.4 kg)    Physical Exam  Constitutional: He is  oriented to person, place, and time. He appears well-developed and well-nourished. No distress.  Eyes: Conjunctivae are normal. No scleral icterus.  Musculoskeletal: Normal range of motion. He exhibits no edema or tenderness.       Back:  Neurological: He is alert and oriented to person, place, and time. Coordination normal.  Skin: Skin is warm and dry. No rash noted. He is not diaphoretic.  Psychiatric: He has a normal mood and affect. His behavior is normal.  Nursing note and vitals reviewed.      Assessment & Plan:   Problem List Items Addressed This Visit      Other   Low back pain - Primary    Recommended meloxicam and stretching and tennis ball massage and heating pad      Relevant Medications   meloxicam (MOBIC) 15 MG tablet       Follow up plan: Return if symptoms worsen or fail to improve.  Counseling provided for all of the vaccine components No orders of the defined types were placed in this encounter.   Arville Care, MD [facility] 11/17/2016, 10:27 AM

## 2016-11-17 NOTE — Assessment & Plan Note (Signed)
Recommended meloxicam and stretching and tennis ball massage and heating pad

## 2017-01-19 ENCOUNTER — Other Ambulatory Visit: Payer: Self-pay | Admitting: Family Medicine

## 2017-01-19 DIAGNOSIS — M545 Low back pain, unspecified: Secondary | ICD-10-CM

## 2017-06-04 ENCOUNTER — Other Ambulatory Visit: Payer: Self-pay | Admitting: Family Medicine

## 2017-06-04 DIAGNOSIS — I10 Essential (primary) hypertension: Secondary | ICD-10-CM

## 2017-08-13 ENCOUNTER — Ambulatory Visit: Payer: Managed Care, Other (non HMO) | Admitting: Family Medicine

## 2017-08-13 ENCOUNTER — Encounter: Payer: Self-pay | Admitting: Family Medicine

## 2017-08-13 ENCOUNTER — Ambulatory Visit (INDEPENDENT_AMBULATORY_CARE_PROVIDER_SITE_OTHER): Payer: Managed Care, Other (non HMO)

## 2017-08-13 VITALS — BP 141/90 | HR 54 | Temp 98.2°F | Ht 73.8 in | Wt 259.8 lb

## 2017-08-13 DIAGNOSIS — M545 Low back pain, unspecified: Secondary | ICD-10-CM

## 2017-08-13 MED ORDER — MELOXICAM 15 MG PO TABS: 15.0000 mg | ORAL_TABLET | Freq: Every day | ORAL | 0 refills | Status: DC

## 2017-08-13 NOTE — Patient Instructions (Signed)
Great to see you!  Come back or call with any concerns  Start meloxicam 1 pill once daily for 2 weeks Use heat and massage as much as it helps  PT is prescribed to help you long term with your back.   We will call with xray results.

## 2017-08-13 NOTE — Progress Notes (Signed)
   HPI  Patient presents today here for back pain.  Patient explains he has had back pain now for about 2 days.  He picked up the toes at work and had sudden onset back pain.  He has had an episode similar to this previously which improved with oral medications.  He denies any pain radiating down the leg.  He does have difficulty and increased pain with lifting up his right leg.  He denies any numbness, tingling. He is interested in how to keep this from happening again.  PMH: Smoking status noted ROS: Per HPI  Objective: BP (!) 141/90   Pulse (!) 54   Temp 98.2 F (36.8 C) (Oral)   Ht 6' 1.8" (1.875 m)   Wt 259 lb 12.8 oz (117.8 kg)   BMI 33.54 kg/m  Gen: NAD, alert, cooperative with exam HEENT: NCAT CV: RRR, good S1/S2, no murmur Resp: CTABL, no wheezes, non-labored Ext: No edema, warm Neuro: Alert and oriented, 2+ symmetric patellar tendon reflexes Strength 5/5 and sensation intact in bilateral lower extremities   Assessment and plan:  #R Sided low back pain No sciatica Start NSAIDs, rest Discussed supportive care with massage and heat Physical therapy referral [facility] Return to clinic with any concerns 3 days out of work  Orders Placed This Encounter  Procedures  . DG Lumbar Spine 2-3 Views    Standing Status:   Future    Number of Occurrences:   1    Standing Expiration Date:   10/13/2018    Order Specific Question:   Reason for Exam (SYMPTOM  OR DIAGNOSIS REQUIRED)    Answer:   low back pain R sided    Order Specific Question:   Preferred imaging location?    Answer:   Internal  . PT eval and treat    Standing Status:   Future    Standing Expiration Date:   08/14/2018    Meds ordered this encounter  Medications  . meloxicam (MOBIC) 15 MG tablet    Sig: Take 1 tablet (15 mg total) by mouth daily.    Dispense:  14 tablet    Refill:  0    Murtis SinkSam Skylar Flynt, MD [facility] 08/13/2017, 3:23 PM

## 2017-10-06 ENCOUNTER — Other Ambulatory Visit: Payer: Self-pay | Admitting: Family Medicine

## 2017-10-06 DIAGNOSIS — I10 Essential (primary) hypertension: Secondary | ICD-10-CM

## 2018-07-12 ENCOUNTER — Telehealth: Payer: Self-pay | Admitting: Family Medicine

## 2018-07-17 ENCOUNTER — Other Ambulatory Visit: Payer: Self-pay

## 2018-07-18 ENCOUNTER — Ambulatory Visit: Payer: Managed Care, Other (non HMO) | Admitting: Family Medicine

## 2018-07-18 ENCOUNTER — Encounter: Payer: Self-pay | Admitting: Family Medicine

## 2018-07-18 VITALS — BP 134/86 | HR 48 | Temp 98.6°F | Ht 73.8 in | Wt 249.0 lb

## 2018-07-18 DIAGNOSIS — E785 Hyperlipidemia, unspecified: Secondary | ICD-10-CM

## 2018-07-18 DIAGNOSIS — I10 Essential (primary) hypertension: Secondary | ICD-10-CM | POA: Diagnosis not present

## 2018-07-18 MED ORDER — OLMESARTAN MEDOXOMIL-HCTZ 20-12.5 MG PO TABS: 1.0000 | ORAL_TABLET | Freq: Every day | ORAL | 3 refills | Status: DC

## 2018-07-18 NOTE — Progress Notes (Signed)
BP 134/86   Pulse (!) 48   Temp 98.6 F (37 C) (Oral)   Ht 6' 1.8" (1.875 m)   Wt 249 lb (112.9 kg)   BMI 32.14 kg/m    Subjective:   Patient ID: Alex Howell, male    DOB: [birth year], 48 y.o.   MRN: [id number]  HPI: Alex Howell is a 48 y.o. male presenting on 07/18/2018 for Hypertension   HPI Hypertension Patient is currently on olmesartan-hydrochlorothiazide, and their blood pressure today is 134/86. Patient denies any lightheadedness or dizziness. Patient denies headaches, blurred vision, chest pains, shortness of breath, or weakness. Denies any side effects from medication and is content with current medication.   Hyperlipidemia Patient is coming in for recheck of his hyperlipidemia. The patient is currently taking no medication currently but will check blood work today. They deny any issues with myalgias or history of liver damage from it. They deny any focal numbness or weakness or chest pain.   Relevant past medical, surgical, family and social history reviewed and updated as indicated. Interim medical history since our last visit reviewed. Allergies and medications reviewed and updated.  Review of Systems  Constitutional: Negative for chills and fever.  Eyes: Negative for visual disturbance.  Respiratory: Negative for shortness of breath and wheezing.   Cardiovascular: Negative for chest pain and leg swelling.  Musculoskeletal: Negative for back pain and gait problem.  Skin: Negative for rash.  Neurological: Negative for dizziness, weakness and light-headedness.  All other systems reviewed and are negative.   Per HPI unless specifically indicated above   Allergies as of 07/18/2018   No Known Allergies     Medication List       Accurate as of Jul 18, 2018  4:19 PM. If you have any questions, ask your nurse or doctor.        STOP taking these medications   meloxicam 15 MG tablet Commonly known as:  MOBIC     TAKE these medications    olmesartan-hydrochlorothiazide 20-12.5 MG tablet Commonly known as:  BENICAR HCT TAKE 1 TABLET BY MOUTH EVERY DAY        Objective:   BP 134/86   Pulse (!) 48   Temp 98.6 F (37 C) (Oral)   Ht 6' 1.8" (1.875 m)   Wt 249 lb (112.9 kg)   BMI 32.14 kg/m   Wt Readings from Last 3 Encounters:  07/18/18 249 lb (112.9 kg)  08/13/17 259 lb 12.8 oz (117.8 kg)  11/17/16 261 lb (118.4 kg)    Physical Exam Vitals signs and nursing note reviewed.  Constitutional:      General: He is not in acute distress.    Appearance: He is well-developed. He is not diaphoretic.  Eyes:     General: No scleral icterus.    Conjunctiva/sclera: Conjunctivae normal.  Neck:     Musculoskeletal: Neck supple.     Thyroid: No thyromegaly.  Cardiovascular:     Rate and Rhythm: Normal rate and regular rhythm.     Heart sounds: Normal heart sounds. No murmur.  Pulmonary:     Effort: Pulmonary effort is normal. No respiratory distress.     Breath sounds: Normal breath sounds. No wheezing.  Lymphadenopathy:     Cervical: No cervical adenopathy.  Skin:    General: Skin is warm and dry.     Findings: No rash.  Neurological:     Mental Status: He is alert and oriented to person, place, and time.  Coordination: Coordination normal.  Psychiatric:        Behavior: Behavior normal.       Assessment & Plan:   Problem List Items Addressed This Visit      Cardiovascular and Mediastinum   HTN, goal below 140/90 - Primary   Relevant Medications   olmesartan-hydrochlorothiazide (BENICAR HCT) 20-12.5 MG tablet   Other Relevant Orders   CMP14+EGFR (Completed)     Other   Dyslipidemia   Relevant Orders   Lipid panel (Completed)      Continue on losartan-hydrochlorothiazide Follow up plan: No follow-ups on file.  Counseling provided for all of the vaccine components No orders of the defined types were placed in this encounter.   Caryl Pina, MD [facility]  07/18/2018, 4:19 PM

## 2018-07-19 LAB — LIPID PANEL
Chol/HDL Ratio: 4.4 ratio (ref 0.0–5.0)
Cholesterol, Total: 196 mg/dL (ref 100–199)
HDL: 45 mg/dL (ref >39)
LDL Calculated: 136 mg/dL — ABNORMAL HIGH (ref 0–99)
Triglycerides: 77 mg/dL (ref 0–149)
VLDL Cholesterol Cal: 15 mg/dL (ref 5–40)

## 2018-07-19 LAB — CMP14+EGFR
ALT: 15 IU/L (ref 0–44)
AST: 23 IU/L (ref 0–40)
Albumin/Globulin Ratio: 1.7 (ref 1.2–2.2)
Albumin: 4.6 g/dL (ref 4.0–5.0)
Alkaline Phosphatase: 74 IU/L (ref 39–117)
BUN/Creatinine Ratio: 14 (ref 9–20)
BUN: 14 mg/dL (ref 6–24)
Bilirubin Total: 0.2 mg/dL (ref 0.0–1.2)
CO2: 23 mmol/L (ref 20–29)
Calcium: 9.5 mg/dL (ref 8.7–10.2)
Chloride: 103 mmol/L (ref 96–106)
Creatinine, Ser: 0.97 mg/dL (ref 0.76–1.27)
GFR calc Af Amer: 106 mL/min/{1.73_m2} (ref >59)
GFR calc non Af Amer: 92 mL/min/{1.73_m2} (ref >59)
Globulin, Total: 2.7 g/dL (ref 1.5–4.5)
Sodium: 142 mmol/L (ref 134–144)
Total Protein: 7.3 g/dL (ref 6.0–8.5)

## 2018-10-24 ENCOUNTER — Other Ambulatory Visit: Payer: Self-pay

## 2018-10-24 DIAGNOSIS — Z20822 Contact with and (suspected) exposure to covid-19: Secondary | ICD-10-CM

## 2018-10-25 LAB — NOVEL CORONAVIRUS, NAA: SARS-CoV-2, NAA: NOT DETECTED

## 2018-11-26 IMAGING — DX DG LUMBAR SPINE 2-3V
2 series · 2 of 2 positions shown · non-contrast
Comparison: None.

CLINICAL DATA: 46-year-old male with right-sided back pain after
picking up object last week. Initial encounter.

EXAM:
LUMBAR SPINE - 2-3 VIEW

[l-spine ap]
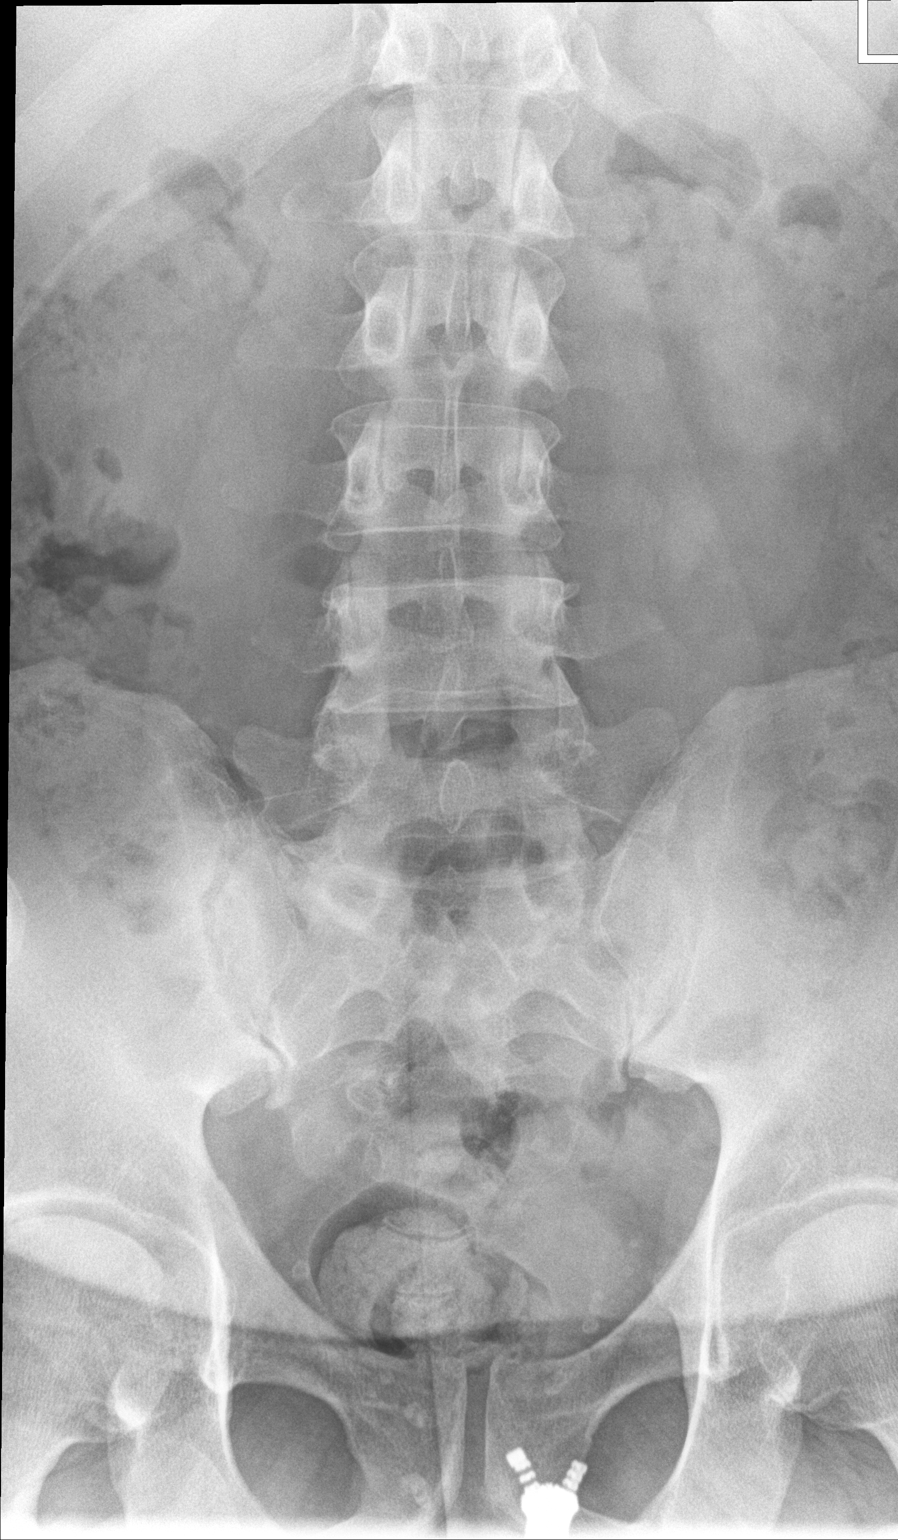

[l-spine lat]
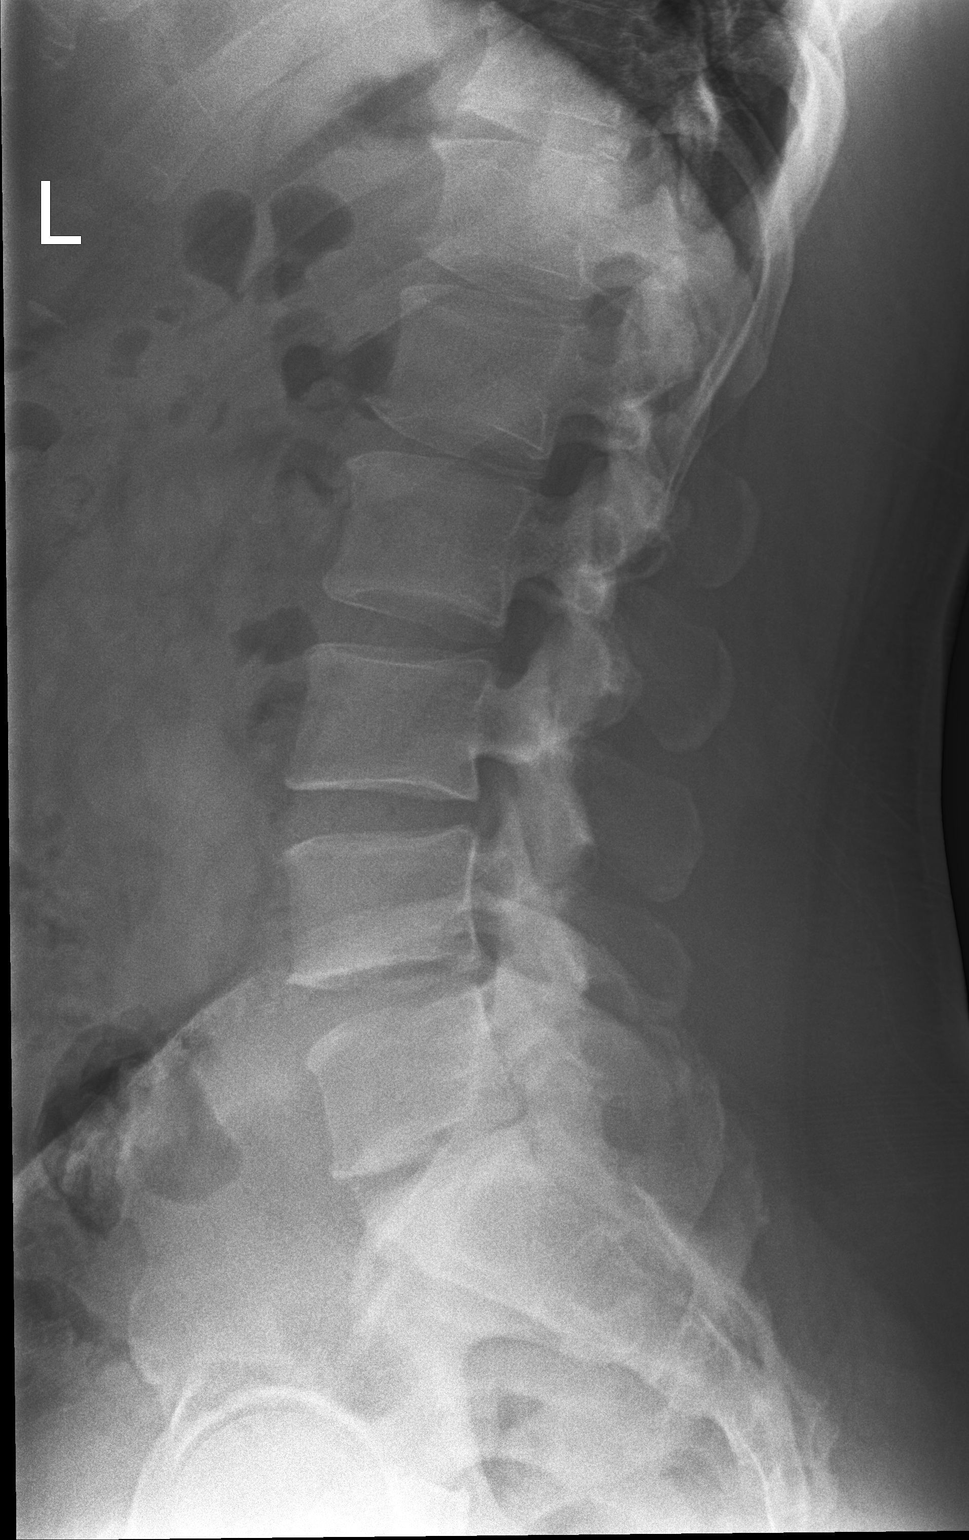

[2 of 2 positions shown; findings below may reference images not displayed]

FINDINGS: Minimal curvature lumbar spine convex right.

Minimal L4-5 and mild L5-S1 disc space narrowing.

Minimal sclerosis surrounds the right sacroiliac joint.
IMPRESSION: Minimal L4-5 and mild L5-S1 disc space narrowing.

Minimal right sacroiliac joint degenerative changes suspected.

## 2019-01-30 ENCOUNTER — Encounter: Payer: Self-pay | Admitting: Family Medicine

## 2019-01-30 ENCOUNTER — Ambulatory Visit (INDEPENDENT_AMBULATORY_CARE_PROVIDER_SITE_OTHER): Payer: Managed Care, Other (non HMO) | Admitting: Family Medicine

## 2019-01-30 DIAGNOSIS — R197 Diarrhea, unspecified: Secondary | ICD-10-CM | POA: Diagnosis not present

## 2019-01-30 NOTE — Progress Notes (Signed)
   Virtual Visit via Telephone Note  I connected with Alex Howell on 01/30/19 at 8:01 AM by telephone and verified that I am speaking with the correct person using two identifiers. Alex Howell is currently located at work and nobody is currently with him during this visit. The provider, Loman Brooklyn, FNP is located in their home at time of visit.  I discussed the limitations, risks, security and privacy concerns of performing an evaluation and management service by telephone and the availability of in person appointments. I also discussed with the patient that there may be a patient responsible charge related to this service. The patient expressed understanding and agreed to proceed.  Subjective: PCP: Dettinger, Fransisca Kaufmann, MD  Chief Complaint  Patient presents with  . GI Problem   Patient reports he ate something wrong and was experiencing diarrhea for 2 days.  He feels like since he is 43 he should be having a colonoscopy.  He is not having any more stomach issues.   ROS: Per HPI  Current Outpatient Medications:  .  olmesartan-hydrochlorothiazide (BENICAR HCT) 20-12.5 MG tablet, Take 1 tablet by mouth daily., Disp: 90 tablet, Rfl: 3  No Known Allergies Past Medical History:  Diagnosis Date  . Hypertension     Observations/Objective: A&O  No respiratory distress or wheezing audible over the phone Mood, judgement, and thought processes all WNL  Assessment and Plan: 1. Diarrhea, unspecified type - Discussed with patient that his insurance would not cover a colonoscopy until he is 40 so if he would like for me to refer him for a colonoscopy he needs to have something wrong with him.  His diarrhea has resolved and was likely due to something he ate, which is not an indication for a colonoscopy.  Advised him to follow-up with his PCP about this when he gets closer to 49 so that a referral can be placed.   Follow Up Instructions:  I discussed the assessment and  treatment plan with the patient. The patient was provided an opportunity to ask questions and all were answered. The patient agreed with the plan and demonstrated an understanding of the instructions.   The patient was advised to call back or seek an in-person evaluation if the symptoms worsen or if the condition fails to improve as anticipated.  The above assessment and management plan was discussed with the patient. The patient verbalized understanding of and has agreed to the management plan. Patient is aware to call the clinic if symptoms persist or worsen. Patient is aware when to return to the clinic for a follow-up visit. Patient educated on when it is appropriate to go to the emergency department.   Time call ended: 8:03 AM  I provided 4 minutes of non-face-to-face time during this encounter.  Hendricks Limes, MSN, APRN, FNP-C [facility] 01/30/19

## 2019-04-16 ENCOUNTER — Other Ambulatory Visit: Payer: Self-pay

## 2019-04-17 ENCOUNTER — Other Ambulatory Visit: Payer: Self-pay

## 2019-04-18 ENCOUNTER — Encounter: Payer: Self-pay | Admitting: Family Medicine

## 2019-04-18 ENCOUNTER — Ambulatory Visit: Payer: Managed Care, Other (non HMO) | Admitting: Family Medicine

## 2019-04-18 VITALS — BP 128/80 | HR 45 | Temp 97.3°F | Ht 73.0 in | Wt 250.0 lb

## 2019-04-18 DIAGNOSIS — M7989 Other specified soft tissue disorders: Secondary | ICD-10-CM

## 2019-04-18 DIAGNOSIS — M545 Low back pain, unspecified: Secondary | ICD-10-CM

## 2019-04-18 MED ORDER — TIZANIDINE HCL 4 MG PO TABS: 2.0000 mg | ORAL_TABLET | Freq: Three times a day (TID) | ORAL | 0 refills | Status: DC | PRN

## 2019-04-18 NOTE — Progress Notes (Signed)
Subjective: CC: Lumbar pain PCP: Alex Howell, Alex Kaufmann, Alex Howell HCW:CBJSEG Alex Howell is a 49 y.o. male presenting to clinic today for:  1.  Low back pain Patient reports couple day history of intermittent right-sided low back pain.  He notes that this seems to be present when he is getting up from a seated position like getting out of his car.  He denies any preceding injury.  No sensation changes.  No lower extremity weakness.  He has not taken or used any therapies for back pain because it has not been severe.  He describes it as occasionally sharp.   ROS: Per HPI  No Known Allergies Past Medical History:  Diagnosis Date  . Hypertension     Current Outpatient Medications:  .  olmesartan-hydrochlorothiazide (BENICAR HCT) 20-12.5 MG tablet, Take 1 tablet by mouth daily., Disp: 90 tablet, Rfl: 3 .  tiZANidine (ZANAFLEX) 4 MG tablet, Take 0.5-1 tablets (2-4 mg total) by mouth every 8 (eight) hours as needed for muscle spasms., Disp: 30 tablet, Rfl: 0 Social History   Socioeconomic History  . Marital status: Married    Spouse name: Not on file  . Number of children: Not on file  . Years of education: Not on file  . Highest education level: Not on file  Occupational History  . Not on file  Tobacco Use  . Smoking status: Never Smoker  . Smokeless tobacco: Never Used  Substance and Sexual Activity  . Alcohol use: Yes    Comment: occ  . Drug use: No  . Sexual activity: Not on file  Other Topics Concern  . Not on file  Social History Narrative  . Not on file   Social Determinants of Health   Financial Resource Strain:   . Difficulty of Paying Living Expenses: Not on file  Food Insecurity:   . Worried About Charity fundraiser in the Last Year: Not on file  . Ran Out of Food in the Last Year: Not on file  Transportation Needs:   . Lack of Transportation (Medical): Not on file  . Lack of Transportation (Non-Medical): Not on file  Physical Activity:   . Days of Exercise per  Week: Not on file  . Minutes of Exercise per Session: Not on file  Stress:   . Feeling of Stress : Not on file  Social Connections:   . Frequency of Communication with Friends and Family: Not on file  . Frequency of Social Gatherings with Friends and Family: Not on file  . Attends Religious Services: Not on file  . Active Member of Clubs or Organizations: Not on file  . Attends Archivist Meetings: Not on file  . Marital Status: Not on file  Intimate Partner Violence:   . Fear of Current or Ex-Partner: Not on file  . Emotionally Abused: Not on file  . Physically Abused: Not on file  . Sexually Abused: Not on file   Family History  Problem Relation Age of Onset  . Cancer Mother   . Hypertension Mother   . Diabetes Mother   . Hypertension Father     Objective: Office vital signs reviewed. BP 128/80   Pulse (!) 45   Temp (!) 97.3 F (36.3 C) (Temporal)   Ht 6\' 1"  (1.854 m)   Wt 250 lb (113.4 kg)   SpO2 100%   BMI 32.98 kg/m   Physical Examination:  General: Awake, alert, well nourished, No acute distress Extremities: warm, well perfused, No edema, cyanosis  or clubbing; +2 pulses bilaterally MSK: normal gait and station  Lumbar spine: Has full painless active range of motion; no palpable spiny deformities.  He does have a prune sized soft tissue mass noted along the right posterior flank that is rubbery in texture.  No midline tenderness to palpation.  No paraspinal muscle tenderness palpation.  Negative seated straight leg raise Neuro: Normal strength and light touch sensation grossly intact  Assessment/ Plan: 49 y.o. male   1. Acute right-sided low back pain without sciatica Likely secondary to lumbar spasm. Given home health physical therapy to have on hand.  Discussed use of NSAIDs, heat, return precautions.  He will follow as needed on this issue - tiZANidine (ZANAFLEX) 4 MG tablet; Take 0.5-1 tablets (2-4 mg total) by mouth every 8 (eight) hours as needed  for muscle spasms.  Dispense: 30 tablet; Refill: 0  2. Soft tissue mass I suspect this to be a lipoma versus epidermal cyst.  Not painful on exam.  Of course differential diagnoses include liposarcoma.  Recommend continuing to monitor and follow-up with Alex Howell.  May need to consider obtaining ultrasound to further characterize.   No orders of the defined types were placed in this encounter.  Meds ordered this encounter  Medications  . tiZANidine (ZANAFLEX) 4 MG tablet    Sig: Take 0.5-1 tablets (2-4 mg total) by mouth every 8 (eight) hours as needed for muscle spasms.    Dispense:  30 tablet    Refill:  0   At the end of our visit he did ask for more information on how to keep a low cholesterol diet and I gave him a copy of the Mediterranean diet.  He also noted some intermittent numbness and tingling at the tips of his fingers and I gave him more information on carpal tunnel.  He will follow-up as needed with his PCP for these issues.  Alex Ip, Alex Howell [facility] [phone]

## 2019-04-18 NOTE — Patient Instructions (Signed)
We discussed use of heat/ the home exercises I gave you and NSAIDs (aleve, motrin ,etc).  I have given you a prescription to have on hand of a muscle relaxer if things get bad.  Mediterranean Diet A Mediterranean diet refers to food and lifestyle choices that are based on the traditions of countries located on the [employer]. This way of eating has been shown to help prevent certain conditions and improve outcomes for people who have chronic diseases, like kidney disease and heart disease. What are tips for following this plan? Lifestyle  Cook and eat meals together with your family, when possible.  Drink enough fluid to keep your urine clear or pale yellow.  Be physically active every day. This includes: ? Aerobic exercise like running or swimming. ? Leisure activities like gardening, walking, or housework.  Get 7-8 hours of sleep each night.  If recommended by your health care provider, drink red wine in moderation. This means 1 glass a day for nonpregnant women and 2 glasses a day for men. A glass of wine equals 5 oz (150 mL). Reading food labels   Check the serving size of packaged foods. For foods such as rice and pasta, the serving size refers to the amount of cooked product, not dry.  Check the total fat in packaged foods. Avoid foods that have saturated fat or trans fats.  Check the ingredients list for added sugars, such as corn syrup. Shopping  At the grocery store, buy most of your food from the areas near the walls of the store. This includes: ? Fresh fruits and vegetables (produce). ? Grains, beans, nuts, and seeds. Some of these may be available in unpackaged forms or large amounts (in bulk). ? Fresh seafood. ? Poultry and eggs. ? Low-fat dairy products.  Buy whole ingredients instead of prepackaged foods.  Buy fresh fruits and vegetables in-season from local farmers markets.  Buy frozen fruits and vegetables in resealable bags.  If you do not have access  to quality fresh seafood, buy precooked frozen shrimp or canned fish, such as tuna, salmon, or sardines.  Buy small amounts of raw or cooked vegetables, salads, or olives from the deli or salad bar at your store.  Stock your pantry so you always have certain foods on hand, such as olive oil, canned tuna, canned tomatoes, rice, pasta, and beans. Cooking  Cook foods with extra-virgin olive oil instead of using butter or other vegetable oils.  Have meat as a side dish, and have vegetables or grains as your main dish. This means having meat in small portions or adding small amounts of meat to foods like pasta or stew.  Use beans or vegetables instead of meat in common dishes like chili or lasagna.  Experiment with different cooking methods. Try roasting or broiling vegetables instead of steaming or sauteing them.  Add frozen vegetables to soups, stews, pasta, or rice.  Add nuts or seeds for added healthy fat at each meal. You can add these to yogurt, salads, or vegetable dishes.  Marinate fish or vegetables using olive oil, lemon juice, garlic, and fresh herbs. Meal planning   Plan to eat 1 vegetarian meal one day each week. Try to work up to 2 vegetarian meals, if possible.  Eat seafood 2 or more times a week.  Have healthy snacks readily available, such as: ? Vegetable sticks with hummus. ? [place] yogurt. ? Fruit and nut trail mix.  Eat balanced meals throughout the week. This includes: ? Fruit: 2-3  servings a day ? Vegetables: 4-5 servings a day ? Low-fat dairy: 2 servings a day ? Fish, poultry, or lean meat: 1 serving a day ? Beans and legumes: 2 or more servings a week ? Nuts and seeds: 1-2 servings a day ? Whole grains: 6-8 servings a day ? Extra-virgin olive oil: 3-4 servings a day  Limit red meat and sweets to only a few servings a month What are my food choices?  Mediterranean diet ? Recommended  Grains: Whole-grain pasta. Brown rice. Bulgar wheat. Polenta.  Couscous. Whole-wheat bread. Orpah Cobb.  Vegetables: Artichokes. Beets. Broccoli. Cabbage. Carrots. Eggplant. Green beans. Chard. Kale. Spinach. Onions. Leeks. Peas. Squash. Tomatoes. Peppers. Radishes.  Fruits: Apples. Apricots. Avocado. Berries. Bananas. Cherries. Dates. Figs. Grapes. Lemons. Melon. Oranges. Peaches. Plums. Pomegranate.  Meats and other protein foods: Beans. Almonds. Sunflower seeds. Pine nuts. Peanuts. Cod. Salmon. Scallops. Shrimp. Tuna. Tilapia. Clams. Oysters. Eggs.  Dairy: Low-fat milk. Cheese. Greek yogurt.  Beverages: Water. Red wine. Herbal tea.  Fats and oils: Extra virgin olive oil. Avocado oil. Grape seed oil.  Sweets and desserts: [place] yogurt with honey. Baked apples. Poached pears. Trail mix.  Seasoning and other foods: Basil. Cilantro. Coriander. Cumin. Mint. Parsley. Sage. Rosemary. Tarragon. Garlic. Oregano. Thyme. Pepper. Balsalmic vinegar. Tahini. Hummus. Tomato sauce. Olives. Mushrooms. ? Limit these  Grains: Prepackaged pasta or rice dishes. Prepackaged cereal with added sugar.  Vegetables: Deep fried potatoes (french fries).  Fruits: Fruit canned in syrup.  Meats and other protein foods: Beef. Pork. Lamb. Poultry with skin. Hot dogs. Tomasa Blase.  Dairy: Ice cream. Sour cream. Whole milk.  Beverages: Juice. Sugar-sweetened soft drinks. Beer. Liquor and spirits.  Fats and oils: Butter. Canola oil. Vegetable oil. Beef fat (tallow). Lard.  Sweets and desserts: Cookies. Cakes. Pies. Candy.  Seasoning and other foods: Mayonnaise. Premade sauces and marinades. The items listed may not be a complete list. Talk with your dietitian about what dietary choices are right for you. Summary  The Mediterranean diet includes both food and lifestyle choices.  Eat a variety of fresh fruits and vegetables, beans, nuts, seeds, and whole grains.  Limit the amount of red meat and sweets that you eat.  Talk with your health care provider about whether it  is safe for you to drink red wine in moderation. This means 1 glass a day for nonpregnant women and 2 glasses a day for men. A glass of wine equals 5 oz (150 mL). This information is not intended to replace advice given to you by your health care provider. Make sure you discuss any questions you have with your health care provider. Document Revised: 10/07/2015 Document Reviewed: 09/30/2015 Elsevier Patient Education  2020 Else[employer] Inc[employer] Carpal Tunnel Syndrome  Carpal tunnel syndrome is a condition that causes pain in your hand and arm. The carpal tunnel is a narrow area that is on the palm side of your wrist. Repeated wrist motion or certain diseases may cause swelling in the tunnel. This swelling can pinch the main nerve in the wrist (median nerve). What are the causes? This condition may be caused by:  Repeated wrist motions.  Wrist injuries.  Arthritis.  A sac of fluid (cyst) or abnormal growth (tumor) in the carpal tunnel.  Fluid buildup during pregnancy. Sometimes the cause is not known. What increases the risk? The following factors may make you more likely to develop this condition:  Having a job in which you move your wrist in the same way many times. This includes jobs like  being a Midwife or a Conservation officer, nature.  Being a woman.  Having other health conditions, such as: ? Diabetes. ? Obesity. ? A thyroid gland that is not active enough (hypothyroidism). ? Kidney failure. What are the signs or symptoms? Symptoms of this condition include:  A tingling feeling in your fingers.  Tingling or a loss of feeling (numbness) in your hand.  Pain in your entire arm. This pain may get worse when you bend your wrist and elbow for a long time.  Pain in your wrist that goes up your arm to your shoulder.  Pain that goes down into your palm or fingers.  A weak feeling in your hands. You may find it hard to grab and hold items. You may feel worse at night. How is this diagnosed? This  condition is diagnosed with a medical history and physical exam. You may also have tests, such as:  Electromyogram (EMG). This test checks the signals that the nerves send to the muscles.  Nerve conduction study. This test checks how well signals pass through your nerves.  Imaging tests, such as X-rays, ultrasound, and MRI. These tests check for what might be the cause of your condition. How is this treated? This condition may be treated with:  Lifestyle changes. You will be asked to stop or change the activity that caused your problem.  Doing exercise and activities that make bones and muscles stronger (physical therapy).  Learning how to use your hand again (occupational therapy).  Medicines for pain and swelling (inflammation). You may have injections in your wrist.  A wrist splint.  Surgery. Follow these instructions at home: If you have a splint:  Wear the splint as told by your doctor. Remove it only as told by your doctor.  Loosen the splint if your fingers: ? Tingle. ? Lose feeling (become numb). ? Turn cold and blue.  Keep the splint clean.  If the splint is not waterproof: ? Do not let it get wet. ? Cover it with a watertight covering when you take a bath or a shower. Managing pain, stiffness, and swelling   If told, put ice on the painful area: ? If you have a removable splint, remove it as told by your doctor. ? Put ice in a plastic bag. ? Place a towel between your skin and the bag. ? Leave the ice on for 20 minutes, 2-3 times per day. General instructions  Take over-the-counter and prescription medicines only as told by your doctor.  Rest your wrist from any activity that may cause pain. If needed, talk with your boss at work about changes that can help your wrist heal.  Do any exercises as told by your doctor, physical therapist, or occupational therapist.  Keep all follow-up visits as told by your doctor. This is important. Contact a doctor  if:  You have new symptoms.  Medicine does not help your pain.  Your symptoms get worse. Get help right away if:  You have very bad numbness or tingling in your wrist or hand. Summary  Carpal tunnel syndrome is a condition that causes pain in your hand and arm.  It is often caused by repeated wrist motions.  Lifestyle changes and medicines are used to treat this problem. Surgery may help in very bad cases.  Follow your doctor's instructions about wearing a splint, resting your wrist, keeping follow-up visits, and calling for help. This information is not intended to replace advice given to you by your health care provider. Make sure  you discuss any questions you have with your health care provider. Document Revised: 06/15/2017 Document Reviewed: 06/15/2017 Elsevier Patient Education  [employer].

## 2019-08-19 ENCOUNTER — Other Ambulatory Visit: Payer: Self-pay | Admitting: Family Medicine

## 2019-08-19 DIAGNOSIS — I10 Essential (primary) hypertension: Secondary | ICD-10-CM

## 2019-08-19 NOTE — Telephone Encounter (Signed)
Last OV 06/2018 No follow up scheduled 30 day supply sent in  ntbs for further refills

## 2019-09-16 ENCOUNTER — Other Ambulatory Visit: Payer: Self-pay | Admitting: Family Medicine

## 2019-09-16 DIAGNOSIS — I10 Essential (primary) hypertension: Secondary | ICD-10-CM

## 2019-09-16 NOTE — Telephone Encounter (Signed)
Dettinger NTBS 30 days given 08/19/19

## 2019-09-17 ENCOUNTER — Other Ambulatory Visit: Payer: Self-pay | Admitting: *Deleted

## 2019-09-17 DIAGNOSIS — I10 Essential (primary) hypertension: Secondary | ICD-10-CM

## 2019-09-17 MED ORDER — OLMESARTAN MEDOXOMIL-HCTZ 20-12.5 MG PO TABS: 1.0000 | ORAL_TABLET | Freq: Every day | ORAL | 0 refills | Status: DC

## 2019-09-17 NOTE — Telephone Encounter (Signed)
Appointment scheduled, 30 day script sent

## 2019-10-07 ENCOUNTER — Ambulatory Visit: Payer: Managed Care, Other (non HMO) | Admitting: Family Medicine

## 2019-10-07 ENCOUNTER — Other Ambulatory Visit: Payer: Self-pay

## 2019-10-07 ENCOUNTER — Encounter: Payer: Self-pay | Admitting: Family Medicine

## 2019-10-07 VITALS — BP 134/84 | HR 52 | Temp 98.2°F | Ht 73.0 in | Wt 252.0 lb

## 2019-10-07 DIAGNOSIS — E785 Hyperlipidemia, unspecified: Secondary | ICD-10-CM

## 2019-10-07 DIAGNOSIS — I10 Essential (primary) hypertension: Secondary | ICD-10-CM

## 2019-10-07 MED ORDER — OLMESARTAN MEDOXOMIL-HCTZ 20-12.5 MG PO TABS: 1.0000 | ORAL_TABLET | Freq: Every day | ORAL | 3 refills | Status: DC

## 2019-10-07 NOTE — Progress Notes (Signed)
BP 134/84   Pulse (!) 52   Temp 98.2 F (36.8 C)   Ht _0  (1.854 m)   Wt 252 lb (114.3 kg)   SpO2 98%   BMI 33.25 kg/m    Subjective:   Patient ID: Alex Howell, male    DOB: 03-08-1970, 49 y.o.   MRN: [id number]  HPI: Alex Howell is a 49 y.o. male presenting on 10/07/2019 for Medical Management of Chronic Issues and Hypertension   HPI Hypertension Patient is currently on olmesartan hydrochlorothiazide, and their blood pressure today is 134/84. Patient denies any lightheadedness or dizziness. Patient denies headaches, blurred vision, chest pains, shortness of breath, or weakness. Denies any side effects from medication and is content with current medication.   Hyperlipidemia Patient is coming in for recheck of his hyperlipidemia. The patient is currently taking no medication currently monitoring. They deny any issues with myalgias or history of liver damage from it. They deny any focal numbness or weakness or chest pain.   Relevant past medical, surgical, family and social history reviewed and updated as indicated. Interim medical history since our last visit reviewed. Allergies and medications reviewed and updated.  Review of Systems  Constitutional: Negative for chills and fever.  Respiratory: Negative for shortness of breath and wheezing.   Cardiovascular: Negative for chest pain and leg swelling.  Musculoskeletal: Negative for back pain and gait problem.  Skin: Negative for rash.  Neurological: Negative for dizziness, weakness and numbness.  All other systems reviewed and are negative.   Per HPI unless specifically indicated above   Allergies as of 10/07/2019   No Known Allergies     Medication List       Accurate as of October 07, 2019  4:14 PM. If you have any questions, ask your nurse or doctor.        olmesartan-hydrochlorothiazide 20-12.5 MG tablet Commonly known as: BENICAR HCT Take 1 tablet by mouth daily. What changed: additional  instructions Changed by: Fransisca Kaufmann Tymira Horkey, MD   tiZANidine 4 MG tablet Commonly known as: Zanaflex Take 0.5-1 tablets (2-4 mg total) by mouth every 8 (eight) hours as needed for muscle spasms.        Objective:   BP 134/84   Pulse (!) 52   Temp 98.2 F (36.8 C)   Ht _1  (1.854 m)   Wt 252 lb (114.3 kg)   SpO2 98%   BMI 33.25 kg/m   Wt Readings from Last 3 Encounters:  10/07/19 252 lb (114.3 kg)  04/18/19 250 lb (113.4 kg)  07/18/18 249 lb (112.9 kg)    Physical Exam Vitals and nursing note reviewed.  Constitutional:      General: He is not in acute distress.    Appearance: He is well-developed. He is not diaphoretic.  Eyes:     General: No scleral icterus.    Conjunctiva/sclera: Conjunctivae normal.  Neck:     Thyroid: No thyromegaly.  Cardiovascular:     Rate and Rhythm: Normal rate and regular rhythm.     Heart sounds: Normal heart sounds. No murmur heard.   Pulmonary:     Effort: Pulmonary effort is normal. No respiratory distress.     Breath sounds: Normal breath sounds. No wheezing.  Musculoskeletal:        General: Normal range of motion.     Cervical back: Neck supple.  Lymphadenopathy:     Cervical: No cervical adenopathy.  Skin:    General: Skin is warm and dry.  Findings: No rash.  Neurological:     Mental Status: He is alert and oriented to person, place, and time.     Coordination: Coordination normal.  Psychiatric:        Behavior: Behavior normal.       Assessment & Plan:   Problem List Items Addressed This Visit      Cardiovascular and Mediastinum   HTN, goal below 140/90   Relevant Medications   olmesartan-hydrochlorothiazide (BENICAR HCT) 20-12.5 MG tablet   Other Relevant Orders   CBC with Differential/Platelet   CMP14+EGFR     Other   Dyslipidemia - Primary   Relevant Orders   CBC with Differential/Platelet   Lipid panel      Continue blood pressure medication, blood pressure looks good, will check labs and  make sure everything is running good.  Discussed healthy lifestyle and diet. Follow up plan: Return in about 1 year (around 10/06/2020), or if symptoms worsen or fail to improve, for Hypertension and hyperlipidemia.  Counseling provided for all of the vaccine components Orders Placed This Encounter  Procedures  . CBC with Differential/Platelet  . CMP14+EGFR  . Lipid panel    Caryl Pina, MD [facility] 10/07/2019, 4:14 PM

## 2019-10-08 LAB — LIPID PANEL
Chol/HDL Ratio: 4.2 ratio (ref 0.0–5.0)
Cholesterol, Total: 208 mg/dL — ABNORMAL HIGH (ref 100–199)
HDL: 49 mg/dL (ref >39)
LDL Chol Calc (NIH): 148 mg/dL — ABNORMAL HIGH (ref 0–99)
Triglycerides: 64 mg/dL (ref 0–149)
VLDL Cholesterol Cal: 11 mg/dL (ref 5–40)

## 2019-10-08 LAB — CMP14+EGFR
ALT: 17 IU/L (ref 0–44)
AST: 20 IU/L (ref 0–40)
Albumin/Globulin Ratio: 1.7 (ref 1.2–2.2)
Albumin: 4.7 g/dL (ref 4.0–5.0)
Alkaline Phosphatase: 72 IU/L (ref 48–121)
BUN/Creatinine Ratio: 15 (ref 9–20)
BUN: 12 mg/dL (ref 6–24)
Bilirubin Total: 0.3 mg/dL (ref 0.0–1.2)
CO2: 25 mmol/L (ref 20–29)
Calcium: 9.5 mg/dL (ref 8.7–10.2)
Chloride: 102 mmol/L (ref 96–106)
Creatinine, Ser: 0.82 mg/dL (ref 0.76–1.27)
GFR calc Af Amer: 120 mL/min/{1.73_m2} (ref >59)
GFR calc non Af Amer: 104 mL/min/{1.73_m2} (ref >59)
Globulin, Total: 2.7 g/dL (ref 1.5–4.5)
Glucose: 91 mg/dL (ref 65–99)
Potassium: 3.9 mmol/L (ref 3.5–5.2)
Sodium: 140 mmol/L (ref 134–144)
Total Protein: 7.4 g/dL (ref 6.0–8.5)

## 2019-10-08 LAB — CBC WITH DIFFERENTIAL/PLATELET
Basophils Absolute: 0.1 10*3/uL (ref 0.0–0.2)
Basos: 1 %
EOS (ABSOLUTE): 0.2 10*3/uL (ref 0.0–0.4)
Eos: 2 %
Hematocrit: 37 % — ABNORMAL LOW (ref 37.5–51.0)
Hemoglobin: 13.1 g/dL (ref 13.0–17.7)
Immature Grans (Abs): 0 10*3/uL (ref 0.0–0.1)
Immature Granulocytes: 0 %
Lymphocytes Absolute: 2.4 10*3/uL (ref 0.7–3.1)
Lymphs: 27 %
MCH: 31 pg (ref 26.6–33.0)
MCHC: 35.4 g/dL (ref 31.5–35.7)
MCV: 88 fL (ref 79–97)
Monocytes Absolute: 0.4 10*3/uL (ref 0.1–0.9)
Monocytes: 5 %
Neutrophils Absolute: 5.9 10*3/uL (ref 1.4–7.0)
Neutrophils: 65 %
Platelets: 331 10*3/uL (ref 150–450)
RBC: 4.23 x10E6/uL (ref 4.14–5.80)
RDW: 12.7 % (ref 11.6–15.4)
WBC: 9 10*3/uL (ref 3.4–10.8)

## 2020-04-12 ENCOUNTER — Ambulatory Visit: Payer: Managed Care, Other (non HMO) | Admitting: Family Medicine

## 2020-05-26 ENCOUNTER — Other Ambulatory Visit: Payer: Self-pay

## 2020-05-26 ENCOUNTER — Ambulatory Visit: Payer: Managed Care, Other (non HMO) | Admitting: Family Medicine

## 2020-05-26 ENCOUNTER — Other Ambulatory Visit (HOSPITAL_COMMUNITY)
Admission: RE | Admit: 2020-05-26 | Discharge: 2020-05-26 | Disposition: A | Discharge status: Home / Self Care | Payer: Self-pay | Source: Ambulatory Visit | Attending: Family Medicine | Admitting: Family Medicine

## 2020-05-26 ENCOUNTER — Encounter: Payer: Self-pay | Admitting: Family Medicine

## 2020-05-26 VITALS — BP 130/82 | HR 108 | Ht 73.0 in | Wt 261.0 lb

## 2020-05-26 DIAGNOSIS — B354 Tinea corporis: Secondary | ICD-10-CM | POA: Diagnosis not present

## 2020-05-26 DIAGNOSIS — I10 Essential (primary) hypertension: Secondary | ICD-10-CM | POA: Diagnosis not present

## 2020-05-26 DIAGNOSIS — E785 Hyperlipidemia, unspecified: Secondary | ICD-10-CM

## 2020-05-26 DIAGNOSIS — Z1211 Encounter for screening for malignant neoplasm of colon: Secondary | ICD-10-CM

## 2020-05-26 DIAGNOSIS — R200 Anesthesia of skin: Secondary | ICD-10-CM

## 2020-05-26 DIAGNOSIS — Z202 Contact with and (suspected) exposure to infections with a predominantly sexual mode of transmission: Secondary | ICD-10-CM | POA: Insufficient documentation

## 2020-05-26 DIAGNOSIS — R202 Paresthesia of skin: Secondary | ICD-10-CM

## 2020-05-26 MED ORDER — OLMESARTAN MEDOXOMIL-HCTZ 20-12.5 MG PO TABS: 1.0000 | ORAL_TABLET | Freq: Every day | ORAL | 3 refills | Status: DC

## 2020-05-26 NOTE — Progress Notes (Signed)
BP 130/82   Pulse (!) 108   Ht 6' 1" (1.854 m)   Wt 261 lb (118.4 kg)   SpO2 97%   BMI 34.43 kg/m    Subjective:   Patient ID: Alex Howell, male    DOB: 1971-01-09, 50 y.o.   MRN: [id number]  HPI: Alex Howell is a 50 y.o. male presenting on 05/26/2020 for Medical Management of Chronic Issues, Hyperlipidemia, and Hypertension   HPI Hypertension Patient is currently on Benicar HCT, and their blood pressure today is 130/82. Patient denies any lightheadedness or dizziness. Patient denies headaches, blurred vision, chest pains, shortness of breath, or weakness. Denies any side effects from medication and is content with current medication.   Hyperlipidemia Patient is coming in for recheck of his hyperlipidemia. The patient is currently taking no medication currently. They deny any issues with myalgias or history of liver damage from it. They deny any focal numbness or weakness or chest pain.   Patient complains of issues in his groin.  He has a rash there especially on the right side is been irritating him and there for couple months and they want to make sure it looked okay.  Patient has been having tingling and numbness in his hands and then he gets some blurred vision occasionally.  He has not had his vision checked.  Says the blurred vision is only when he reads in the tingling and numbness in his fingers in his hands only very occasionally not all the time.  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  He says it will happen over the past 4 months a couple days a week and then will go a couple weeks without it.  Relevant past medical, surgical, family and social history reviewed and updated as indicated. Interim medical history since our last visit reviewed. Allergies and medications reviewed and updated.  Review of Systems  Constitutional: Negative for chills and fever.  Eyes: Negative for visual disturbance.  Respiratory: Negative for shortness of breath and  wheezing.   Cardiovascular: Negative for chest pain and leg swelling.  Musculoskeletal: Negative for back pain and gait problem.  Skin: Positive for rash.  Neurological: Positive for numbness. Negative for dizziness, weakness and light-headedness.  All other systems reviewed and are negative.   Per HPI unless specifically indicated above   Allergies as of 05/26/2020   No Known Allergies     Medication List       Accurate as of May 26, 2020  4:08 PM. If you have any questions, ask your nurse or doctor.        STOP taking these medications   tiZANidine 4 MG tablet Commonly known as: Zanaflex Stopped by: Fransisca Kaufmann Midge Momon, MD     TAKE these medications   olmesartan-hydrochlorothiazide 20-12.5 MG tablet Commonly known as: BENICAR HCT Take 1 tablet by mouth daily.        Objective:   BP 130/82   Pulse (!) 108   Ht 6' 1" (1.854 m)   Wt 261 lb (118.4 kg)   SpO2 97%   BMI 34.43 kg/m   Wt Readings from Last 3 Encounters:  05/26/20 261 lb (118.4 kg)  10/07/19 252 lb (114.3 kg)  04/18/19 250 lb (113.4 kg)    Physical Exam Vitals and nursing note reviewed.  Constitutional:      General: He is not in acute distress.    Appearance: He is well-developed. He is not diaphoretic.  Eyes:  General: No scleral icterus.    Conjunctiva/sclera: Conjunctivae normal.  Neck:     Thyroid: No thyromegaly.  Cardiovascular:     Rate and Rhythm: Normal rate and regular rhythm.     Heart sounds: Normal heart sounds. No murmur heard.   Pulmonary:     Effort: Pulmonary effort is normal. No respiratory distress.     Breath sounds: Normal breath sounds. No wheezing.  Musculoskeletal:        General: No swelling or tenderness. Normal range of motion.     Cervical back: Neck supple.  Lymphadenopathy:     Cervical: No cervical adenopathy.  Skin:    General: Skin is warm and dry.     Findings: Rash (Rash in both sides of groin near testicles, scaly, concerning for tinea.)  present.  Neurological:     Mental Status: He is alert and oriented to person, place, and time.     Coordination: Coordination normal.  Psychiatric:        Behavior: Behavior normal.       Assessment & Plan:   Problem List Items Addressed This Visit      Cardiovascular and Mediastinum   HTN, goal below 140/90 - Primary   Relevant Medications   olmesartan-hydrochlorothiazide (BENICAR HCT) 20-12.5 MG tablet   Other Relevant Orders   CMP14+EGFR     Other   Dyslipidemia   Relevant Orders   Lipid panel    Other Visit Diagnoses    Numbness and tingling       Relevant Orders   CBC with Differential/Platelet   TSH   Vitamin B12   Tinea corporis       Colon cancer screening       Relevant Orders   Ambulatory referral to Gastroenterology   Possible exposure to STD       Relevant Orders   STD Screen (8)   GC/Chlamydia probe amp ([place])not at [facility]      The 10-year ASCVD risk score (Goff DC Jr., et al., 2013) is: 9.2%   Values used to calculate the score:     Age: 49 years     Sex: Male     Is Non-Hispanic African American: Yes     Diabetic: No     Tobacco smoker: No     Systolic Blood Pressure: 130 mmHg     Is BP treated: Yes     HDL Cholesterol: 49 mg/dL     Total Cholesterol: 208 mg/dL   Recommended over-the-counter jock itch cream for his rash.  Patient does have multiple STD risks because he is find partners on the road, will do STD testing.  With tingling and numbness in fingertips will test blood sugar and thyroid and B12.  Continue current blood pressure medicine, discussed possibly starting cholesterol medicine and he is hesitant right now.  We will recheck levels.  Follow up plan: Return in about 6 months (around 11/25/2020), or if symptoms worsen or fail to improve, for Cholesterol hypertension recheck.  Counseling provided for all of the vaccine components Orders Placed This Encounter  Procedures  . CBC with Differential/Platelet  . CMP14+EGFR   . Lipid panel  . TSH  . Vitamin B12  . Ambulatory referral to Gastroenterology    Joshua Dettinger, MD [facility] 05/26/2020, 4:08 PM     

## 2020-05-26 NOTE — Addendum Note (Signed)
Addended by: Arville Care on: 05/26/2020 04:38 PM   Modules accepted: Orders

## 2020-05-27 LAB — STD SCREEN (8)
HIV Screen 4th Generation wRfx: NONREACTIVE
HSV 1 Glycoprotein G Ab, IgG: 58.4 index — ABNORMAL HIGH (ref 0.00–0.90)
HSV 2 IgG, Type Spec: <0.91 index (ref 0.00–0.90)
Hep A IgM: NEGATIVE
Hep B C IgM: NEGATIVE
Hep C Virus Ab: <0.1 s/co ratio (ref 0.0–0.9)
Hepatitis B Surface Ag: NEGATIVE
RPR Ser Ql: NONREACTIVE

## 2020-05-27 LAB — CMP14+EGFR
ALT: 24 IU/L (ref 0–44)
AST: 27 IU/L (ref 0–40)
Albumin/Globulin Ratio: 1.5 (ref 1.2–2.2)
Albumin: 4.9 g/dL (ref 4.0–5.0)
Alkaline Phosphatase: 74 IU/L (ref 44–121)
BUN/Creatinine Ratio: 14 (ref 9–20)
BUN: 16 mg/dL (ref 6–24)
Bilirubin Total: 0.3 mg/dL (ref 0.0–1.2)
CO2: 18 mmol/L — ABNORMAL LOW (ref 20–29)
Calcium: 10.3 mg/dL — ABNORMAL HIGH (ref 8.7–10.2)
Chloride: 101 mmol/L (ref 96–106)
Creatinine, Ser: 1.12 mg/dL (ref 0.76–1.27)
Globulin, Total: 3.3 g/dL (ref 1.5–4.5)
Glucose: 87 mg/dL (ref 65–99)
Potassium: 3.8 mmol/L (ref 3.5–5.2)
Sodium: 141 mmol/L (ref 134–144)
Total Protein: 8.2 g/dL (ref 6.0–8.5)
eGFR: 80 mL/min/{1.73_m2} (ref >59)

## 2020-05-27 LAB — TSH: TSH: 1.06 u[IU]/mL (ref 0.450–4.500)

## 2020-05-27 LAB — VITAMIN B12: Vitamin B-12: 501 pg/mL (ref 232–1245)

## 2020-05-27 LAB — CBC WITH DIFFERENTIAL/PLATELET
Basophils Absolute: 0.1 10*3/uL (ref 0.0–0.2)
Basos: 1 %
EOS (ABSOLUTE): 0.1 10*3/uL (ref 0.0–0.4)
Eos: 1 %
Hematocrit: 38.2 % (ref 37.5–51.0)
Hemoglobin: 13.1 g/dL (ref 13.0–17.7)
Immature Grans (Abs): 0 10*3/uL (ref 0.0–0.1)
Immature Granulocytes: 0 %
Lymphocytes Absolute: 2.3 10*3/uL (ref 0.7–3.1)
Lymphs: 25 %
MCH: 29.6 pg (ref 26.6–33.0)
MCHC: 34.3 g/dL (ref 31.5–35.7)
MCV: 86 fL (ref 79–97)
Monocytes Absolute: 0.4 10*3/uL (ref 0.1–0.9)
Monocytes: 4 %
Neutrophils Absolute: 6.4 10*3/uL (ref 1.4–7.0)
Neutrophils: 69 %
Platelets: 343 10*3/uL (ref 150–450)
RBC: 4.43 x10E6/uL (ref 4.14–5.80)
RDW: 12.3 % (ref 11.6–15.4)
WBC: 9.2 10*3/uL (ref 3.4–10.8)

## 2020-05-27 LAB — LIPID PANEL
Chol/HDL Ratio: 4.1 ratio (ref 0.0–5.0)
Cholesterol, Total: 224 mg/dL — ABNORMAL HIGH (ref 100–199)
HDL: 55 mg/dL (ref >39)
LDL Chol Calc (NIH): 156 mg/dL — ABNORMAL HIGH (ref 0–99)
Triglycerides: 73 mg/dL (ref 0–149)
VLDL Cholesterol Cal: 13 mg/dL (ref 5–40)

## 2020-05-28 LAB — GC/CHLAMYDIA PROBE AMP (~~LOC~~) NOT AT ARMC
Chlamydia: NEGATIVE
Comment: NEGATIVE
Comment: NORMAL
Neisseria Gonorrhea: NEGATIVE

## 2020-07-21 ENCOUNTER — Encounter: Payer: Self-pay | Admitting: Gastroenterology

## 2020-08-09 ENCOUNTER — Other Ambulatory Visit: Payer: Self-pay | Admitting: Nurse Practitioner

## 2020-08-09 ENCOUNTER — Encounter: Payer: Self-pay | Admitting: Nurse Practitioner

## 2020-08-09 ENCOUNTER — Ambulatory Visit: Payer: Managed Care, Other (non HMO) | Admitting: Nurse Practitioner

## 2020-08-09 VITALS — BP 134/88 | HR 53 | Temp 97.3°F

## 2020-08-09 DIAGNOSIS — J029 Acute pharyngitis, unspecified: Secondary | ICD-10-CM | POA: Diagnosis not present

## 2020-08-09 DIAGNOSIS — R6883 Chills (without fever): Secondary | ICD-10-CM | POA: Diagnosis not present

## 2020-08-09 DIAGNOSIS — R059 Cough, unspecified: Secondary | ICD-10-CM | POA: Diagnosis not present

## 2020-08-09 LAB — RAPID STREP SCREEN (MED CTR MEBANE ONLY): Strep Gp A Ag, IA W/Reflex: NEGATIVE

## 2020-08-09 LAB — VERITOR FLU A/B WAIVED
Influenza A: NEGATIVE
Influenza B: NEGATIVE

## 2020-08-09 LAB — CULTURE, GROUP A STREP

## 2020-08-09 MED ORDER — ACETAMINOPHEN 500 MG PO TABS: 500.0000 mg | ORAL_TABLET | Freq: Four times a day (QID) | ORAL | 0 refills | Status: DC | PRN

## 2020-08-09 MED ORDER — BENZONATATE 100 MG PO CAPS: 100.0000 mg | ORAL_CAPSULE | Freq: Three times a day (TID) | ORAL | 0 refills | Status: DC | PRN

## 2020-08-09 MED ORDER — DM-GUAIFENESIN ER 30-600 MG PO TB12: 1.0000 | ORAL_TABLET | Freq: Two times a day (BID) | ORAL | 0 refills | Status: DC

## 2020-08-09 NOTE — Progress Notes (Signed)
Acute Office Visit  Subjective:    Patient ID: Alex Howell, male    DOB: 1970/11/24, 50 y.o.   MRN: [id number]  Chief Complaint  Patient presents with   Cough   Sore Throat   Chills    Sore Throat  This is a new problem. The current episode started in the past 7 days. The problem has been gradually worsening. There has been no fever. The pain is moderate. Associated symptoms include congestion, coughing, headaches and swollen glands.  Cough This is a new problem. The current episode started yesterday. The problem has been gradually worsening. The problem occurs constantly. The cough is Non-productive. Associated symptoms include chills, headaches and a sore throat. He has tried nothing for the symptoms.    Past Medical History:  Diagnosis Date   Hypertension     History reviewed. No pertinent surgical history.  Family History  Problem Relation Age of Onset   Cancer Mother    Hypertension Mother    Diabetes Mother    Hypertension Father     Social History   Socioeconomic History   Marital status: Married    Spouse name: Not on file   Number of children: Not on file   Years of education: Not on file   Highest education level: Not on file  Occupational History   Not on file  Tobacco Use   Smoking status: Never   Smokeless tobacco: Never  Vaping Use   Vaping Use: Never used  Substance and Sexual Activity   Alcohol use: Yes    Comment: occ   Drug use: No   Sexual activity: Not on file  Other Topics Concern   Not on file  Social History Narrative   Not on file   Social Determinants of Health   Financial Resource Strain: Not on file  Food Insecurity: Not on file  Transportation Needs: Not on file  Physical Activity: Not on file  Stress: Not on file  Social Connections: Not on file  Intimate Partner Violence: Not on file    Outpatient Medications Prior to Visit  Medication Sig Dispense Refill   olmesartan-hydrochlorothiazide (BENICAR HCT)  20-12.5 MG tablet Take 1 tablet by mouth daily. 90 tablet 3   No facility-administered medications prior to visit.    No Known Allergies  Review of Systems  Constitutional:  Positive for chills.  HENT:  Positive for congestion and sore throat.   Respiratory:  Positive for cough.   Cardiovascular: Negative.   Gastrointestinal: Negative.   Genitourinary: Negative.   Neurological:  Positive for headaches.  All other systems reviewed and are negative.     Objective:    Physical Exam Vitals and nursing note reviewed.  Constitutional:      Appearance: He is well-developed.  HENT:     Head: Normocephalic.     Nose: Congestion present.  Cardiovascular:     Rate and Rhythm: Normal rate and regular rhythm.     Pulses: Normal pulses.     Heart sounds: Normal heart sounds.  Pulmonary:     Breath sounds: Normal breath sounds.  Abdominal:     General: Bowel sounds are normal.  Skin:    Findings: No rash.  Neurological:     Mental Status: He is alert and oriented to person, place, and time.  Psychiatric:        Behavior: Behavior normal.    BP 134/88   Pulse (!) 53   Temp (!) 97.3 F (36.3 C) (Temporal)  SpO2 98%  Wt Readings from Last 3 Encounters:  05/26/20 261 lb (118.4 kg)  10/07/19 252 lb (114.3 kg)  04/18/19 250 lb (113.4 kg)    Health Maintenance Due  Topic Date Due   Zoster Vaccines- Shingrix (1 of 2) Never done    There are no preventive care reminders to display for this patient.   Lab Results  Component Value Date   TSH 1.060 05/26/2020   Lab Results  Component Value Date   WBC 9.2 05/26/2020   HGB 13.1 05/26/2020   HCT 38.2 05/26/2020   MCV 86 05/26/2020   PLT 343 05/26/2020   Lab Results  Component Value Date   NA 141 05/26/2020   K 3.8 05/26/2020   CO2 18 (L) 05/26/2020   GLUCOSE 87 05/26/2020   BUN 16 05/26/2020   CREATININE 1.12 05/26/2020   BILITOT 0.3 05/26/2020   ALKPHOS 74 05/26/2020   AST 27 05/26/2020   ALT 24 05/26/2020    PROT 8.2 05/26/2020   ALBUMIN 4.9 05/26/2020   CALCIUM 10.3 (H) 05/26/2020   EGFR 80 05/26/2020   Lab Results  Component Value Date   CHOL 224 (H) 05/26/2020   Lab Results  Component Value Date   HDL 55 05/26/2020   Lab Results  Component Value Date   LDLCALC 156 (H) 05/26/2020   Lab Results  Component Value Date   TRIG 73 05/26/2020   Lab Results  Component Value Date   CHOLHDL 4.1 05/26/2020   No results found for: HGBA1C     Assessment & Plan:   Problem List Items Addressed This Visit       Other   Sore throat    Sore throat in the last 4 to 6 days.  Mild to moderate pain.  COVID-19 test completed results pending.  Tylenol/ibuprofen for pain and fever.  Salt water gargle, Chloraseptic spray, strep throat test completed results pending.  Follow-up with worsening unresolved symptoms.       Relevant Orders   Novel Coronavirus, NAA ([facility])   Rapid Strep Screen ([facility] ONLY)   Cough - Primary    Unresolved cough worsening symptoms in the last 24 hours.  Completed COVID-19 test results pending.  Benzonatate and guaifenesin for cough and congestion.  Rx sent to pharmacy.  Education provided to patient with printed handouts given.       Relevant Medications   benzonatate (TESSALON PERLES) 100 MG capsule   dextromethorphan-guaiFENesin (MUCINEX DM) 30-600 MG 12hr tablet   Other Relevant Orders   Novel Coronavirus, NAA ([facility])   Veritor Flu A/B Waived   Other Visit Diagnoses     Chills       Relevant Orders   Novel Coronavirus, NAA ([facility])   Veritor Flu A/B Waived   Rapid Strep Screen ([facility] ONLY)        Meds ordered this encounter  Medications   benzonatate (TESSALON PERLES) 100 MG capsule    Sig: Take 1 capsule (100 mg total) by mouth 3 (three) times daily as needed for cough.    Dispense:  20 capsule    Refill:  0    Order Specific Question:   Supervising Provider    Answer:   Janora Norlander [[id number]]    dextromethorphan-guaiFENesin (MUCINEX DM) 30-600 MG 12hr tablet    Sig: Take 1 tablet by mouth 2 (two) times daily.    Dispense:  30 tablet    Refill:  0    Order Specific Question:  Supervising Provider    Answer:   Janora Norlander [[id number]]   acetaminophen (TYLENOL) 500 MG tablet    Sig: Take 1 tablet (500 mg total) by mouth every 6 (six) hours as needed.    Dispense:  30 tablet    Refill:  0    Order Specific Question:   Supervising Provider    Answer:   Janora Norlander [[id number]]     Ivy Lynn, NP

## 2020-08-09 NOTE — Patient Instructions (Signed)
Cough, Adult A cough helps to clear your throat and lungs. A cough may be a sign of anillness or another medical condition. An acute cough may only last 2-3 weeks, while a chronic cough may last 8 ormore weeks. Many things can cause a cough. They include: Germs (viruses or bacteria) that attack the airway. Breathing in things that bother (irritate) your lungs. Allergies. Asthma. Mucus that runs down the back of your throat (postnasal drip). Smoking. Acid backing up from the stomach into the tube that moves food from the mouth to the stomach (gastroesophageal reflux). Some medicines. Lung problems. Other medical conditions, such as heart failure or a blood clot in the lung (pulmonary embolism). Follow these instructions at home: Medicines Take over-the-counter and prescription medicines only as told by your doctor. Talk with your doctor before you take medicines that stop a cough (cough suppressants). Lifestyle  Do not smoke, and try not to be around smoke. Do not use any products that contain nicotine or tobacco, such as cigarettes, e-cigarettes, and chewing tobacco. If you need help quitting, ask your doctor. Drink enough fluid to keep your pee (urine) pale yellow. Avoid caffeine. Do not drink alcohol if your doctor tells you not to drink.  General instructions  Watch for any changes in your cough. Tell your doctor about them. Always cover your mouth when you cough. Stay away from things that make you cough, such as perfume, candles, campfire smoke, or cleaning products. If the air is dry, use a cool mist vaporizer or humidifier in your home. If your cough is worse at night, try using extra pillows to raise your head up higher while you sleep. Rest as needed. Keep all follow-up visits as told by your doctor. This is important.  Contact a doctor if: You have new symptoms. You cough up pus. Your cough does not get better after 2-3 weeks, or your cough gets worse. Cough medicine  does not help your cough and you are not sleeping well. You have pain that gets worse or pain that is not helped with medicine. You have a fever. You are losing weight and you do not know why. You have night sweats. Get help right away if: You cough up blood. You have trouble breathing. Your heartbeat is very fast. These symptoms may be an emergency. Do not wait to see if the symptoms will go away. Get medical help right away. Call your local emergency services (911 in the [place] Do not drive yourself to the hospital. Summary A cough helps to clear your throat and lungs. Many things can cause a cough. Take over-the-counter and prescription medicines only as told by your doctor. Always cover your mouth when you cough. Contact a doctor if you have new symptoms or you have a cough that does not get better or gets worse. This information is not intended to replace advice given to you by your health care provider. Make sure you discuss any questions you have with your healthcare provider. Document Revised: 03/28/2019 Document Reviewed: 02/25/2018 Elsevier Patient Education  2022 Elsevier Inc. 10 Things You Can Do to Manage Your COVID-19 Symptoms at Home If you have possible or confirmed COVID-19 Stay home except to get medical care. Monitor your symptoms carefully. If your symptoms get worse, call your healthcare provider immediately. Get rest and stay hydrated. If you have a medical appointment, call the healthcare provider ahead of time and tell them that you have or may have COVID-19. For medical emergencies, call 911 and notify  the dispatch personnel that you have or may have COVID-19. Cover your cough and sneezes with a tissue or use the inside of your elbow. Wash your hands often with soap and water for at least 20 seconds or clean your hands with an alcohol-based hand sanitizer that contains at least 60% alcohol. As much as possible, stay in a specific room and away from other people in  your home. Also, you should use a separate bathroom, if available. If you need to be around other people in or outside of the home, wear a mask. Avoid sharing personal items with other people in your household, like dishes, towels, and bedding. Clean all surfaces that are touched often, like counters, tabletops, and doorknobs. Use household cleaning sprays or wipes according to the label instructions. [employer] 09/05/2019 This information is not intended to replace advice given to you by your health care provider. Make sure you discuss any questions you have with your healthcare provider. Document Revised: 03/26/2020 Document Reviewed: 03/26/2020 Elsevier Patient Education  2022 [employer].

## 2020-08-10 LAB — SARS-COV-2, NAA 2 DAY TAT

## 2020-08-10 LAB — NOVEL CORONAVIRUS, NAA: SARS-CoV-2, NAA: DETECTED — AB

## 2020-08-11 ENCOUNTER — Telehealth: Payer: Self-pay | Admitting: Family Medicine

## 2020-08-11 DIAGNOSIS — R059 Cough, unspecified: Secondary | ICD-10-CM | POA: Insufficient documentation

## 2020-08-11 DIAGNOSIS — U071 COVID-19: Secondary | ICD-10-CM

## 2020-08-11 DIAGNOSIS — J029 Acute pharyngitis, unspecified: Secondary | ICD-10-CM | POA: Insufficient documentation

## 2020-08-11 MED ORDER — NIRMATRELVIR/RITONAVIR (PAXLOVID)TABLET: 3.0000 | ORAL_TABLET | Freq: Two times a day (BID) | ORAL | 0 refills | Status: AC

## 2020-08-11 NOTE — Assessment & Plan Note (Signed)
Unresolved cough worsening symptoms in the last 24 hours.  Completed COVID-19 test results pending.  Benzonatate and guaifenesin for cough and congestion.  Rx sent to pharmacy.  Education provided to patient with printed handouts given.

## 2020-08-11 NOTE — Assessment & Plan Note (Signed)
Sore throat in the last 4 to 6 days.  Mild to moderate pain.  COVID-19 test completed results pending.  Tylenol/ibuprofen for pain and fever.  Salt water gargle, Chloraseptic spray, strep throat test completed results pending.  Follow-up with worsening unresolved symptoms.

## 2020-08-11 NOTE — Telephone Encounter (Signed)
Spoke to patient about the Paxlovid.  Advised him to use a backup method of contraception for the next 3 months.  He will follow-up with PCP if needed for any ongoing symptoms or concerns.  Medication has been sent to [facility].  Meds ordered this encounter  Medications   nirmatrelvir/ritonavir EUA (PAXLOVID) TABS    Sig: Take 3 tablets by mouth 2 (two) times daily for 5 days. (Take nirmatrelvir 150 mg two tablets twice daily for 5 days and ritonavir 100 mg one tablet twice daily for 5 days) Patient GFR is 80. Diagnosed 08/10/20. Risk: HTN    Dispense:  30 tablet    Refill:  0

## 2020-10-04 ENCOUNTER — Encounter: Payer: Self-pay | Admitting: Gastroenterology

## 2021-07-28 ENCOUNTER — Other Ambulatory Visit: Payer: Self-pay | Admitting: Family Medicine

## 2021-07-28 DIAGNOSIS — I10 Essential (primary) hypertension: Secondary | ICD-10-CM

## 2021-08-01 ENCOUNTER — Other Ambulatory Visit: Payer: Self-pay | Admitting: Family Medicine

## 2021-08-01 DIAGNOSIS — I10 Essential (primary) hypertension: Secondary | ICD-10-CM

## 2021-08-03 ENCOUNTER — Telehealth: Payer: Self-pay | Admitting: Family Medicine

## 2021-08-03 DIAGNOSIS — I10 Essential (primary) hypertension: Secondary | ICD-10-CM

## 2021-08-03 MED ORDER — OLMESARTAN MEDOXOMIL-HCTZ 20-12.5 MG PO TABS: 1.0000 | ORAL_TABLET | Freq: Every day | ORAL | 0 refills | Status: DC

## 2021-08-03 NOTE — Telephone Encounter (Signed)
  Prescription Request  08/03/2021  Is this a "Controlled Substance" medicine? olmesartan-hydrochlorothiazide (BENICAR HCT) 20-12.5 MG tablet  Have you seen your PCP in the last 2 weeks? no  If YES, route message to pool  -  If NO, patient needs to be scheduled for appointment.  What is the name of the medication or equipment? olmesartan-hydrochlorothiazide (BENICAR HCT) 20-12.5 MG tablet  Have you contacted your pharmacy to request a refill? Yes but pt called in today to check on refill and he is aware he NTBS   Which pharmacy would you like this sent to? [facility]  Pt ntbs for refill. Next available apt is 09/19/2021. Pt is going to be out of town after 08/13/2021. He is completely out. Please call back to schedule sooner apt.   Patient notified that their request is being sent to the clinical staff for review and that they should receive a response within 2 business days.

## 2021-08-03 NOTE — Telephone Encounter (Signed)
[facility] [place]  Pt made aware and appt scheduled for 6/19 at 2:10pm

## 2021-08-08 ENCOUNTER — Ambulatory Visit: Payer: Managed Care, Other (non HMO) | Admitting: Family Medicine

## 2021-08-08 ENCOUNTER — Encounter: Payer: Self-pay | Admitting: Family Medicine

## 2021-08-08 ENCOUNTER — Other Ambulatory Visit: Payer: Self-pay | Admitting: Family Medicine

## 2021-08-08 VITALS — BP 141/78 | HR 57 | Temp 98.0°F | Ht 73.0 in | Wt 261.0 lb

## 2021-08-08 DIAGNOSIS — Z1211 Encounter for screening for malignant neoplasm of colon: Secondary | ICD-10-CM | POA: Diagnosis not present

## 2021-08-08 DIAGNOSIS — E785 Hyperlipidemia, unspecified: Secondary | ICD-10-CM | POA: Diagnosis not present

## 2021-08-08 DIAGNOSIS — I10 Essential (primary) hypertension: Secondary | ICD-10-CM | POA: Diagnosis not present

## 2021-08-08 LAB — BAYER DCA HB A1C WAIVED: HB A1C (BAYER DCA - WAIVED): 5.5 % (ref 4.8–5.6)

## 2021-08-08 MED ORDER — OLMESARTAN MEDOXOMIL-HCTZ 20-12.5 MG PO TABS: 1.0000 | ORAL_TABLET | Freq: Every day | ORAL | 3 refills | Status: DC

## 2021-08-08 NOTE — Patient Instructions (Signed)
For Colonoscopy- [facility]. Their number is [phone].

## 2021-08-08 NOTE — Progress Notes (Signed)
BP (!) 141/78   Pulse (!) 57   Temp 98 F (36.7 C)   Ht '6\' 1"'  (1.854 m)   Wt 261 lb (118.4 kg)   SpO2 99%   BMI 34.43 kg/m    Subjective:   Patient ID: Alex Howell, male    DOB: 11/20/1970, 51 y.o.   MRN: [id number]  HPI: Alex Howell is a 51 y.o. male presenting on 08/08/2021 for Medical Management of Chronic Issues, Hyperlipidemia, and Hypertension   HPI Hypertension Patient is currently on olmesartan hydrochlorothiazide, and their blood pressure today is 141/78. Patient denies any lightheadedness or dizziness. Patient denies headaches, blurred vision, chest pains, shortness of breath, or weakness. Denies any Howell effects from medication and is content with current medication.   Hyperlipidemia Patient is coming in for recheck of his hyperlipidemia. The patient is currently taking no medication, trying diet. They deny any issues with myalgias or history of liver damage from it. They deny any focal numbness or weakness or chest pain.   Elevated blood sugar Patient has elevated blood sugar in the past.  We will check an A1c today.  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  Relevant past medical, surgical, family and social history reviewed and updated as indicated. Interim medical history since our last visit reviewed. Allergies and medications reviewed and updated.  Review of Systems  Constitutional:  Negative for chills and fever.  Eyes:  Negative for visual disturbance.  Respiratory:  Negative for shortness of breath and wheezing.   Cardiovascular:  Negative for chest pain and leg swelling.  Musculoskeletal:  Negative for back pain and gait problem.  Skin:  Negative for rash.  Neurological:  Negative for dizziness, weakness and light-headedness.  All other systems reviewed and are negative.   Per HPI unless specifically indicated above   Allergies as of 08/08/2021   No Known Allergies      Medication List        Accurate as of  August 08, 2021  2:55 PM. If you have any questions, ask your nurse or doctor.          STOP taking these medications    acetaminophen 500 MG tablet Commonly known as: TYLENOL Stopped by: Fransisca Kaufmann Tekila Caillouet, MD   benzonatate 100 MG capsule Commonly known as: Best boy Stopped by: Worthy Rancher, MD   dextromethorphan-guaiFENesin 30-600 MG 12hr tablet Commonly known as: [place] DM Stopped by: Fransisca Kaufmann Zaki Gertsch, MD       TAKE these medications    olmesartan-hydrochlorothiazide 20-12.5 MG tablet Commonly known as: BENICAR HCT Take 1 tablet by mouth daily.         Objective:   BP (!) 141/78   Pulse (!) 57   Temp 98 F (36.7 C)   Ht '6\' 1"'  (1.854 m)   Wt 261 lb (118.4 kg)   SpO2 99%   BMI 34.43 kg/m   Wt Readings from Last 3 Encounters:  08/08/21 261 lb (118.4 kg)  05/26/20 261 lb (118.4 kg)  10/07/19 252 lb (114.3 kg)    Physical Exam Vitals and nursing note reviewed.  Constitutional:      General: He is not in acute distress.    Appearance: He is well-developed. He is not diaphoretic.  Eyes:     General: No scleral icterus.    Conjunctiva/sclera: Conjunctivae normal.  Neck:     Thyroid: No thyromegaly.  Cardiovascular:     Rate and Rhythm: Normal  rate and regular rhythm.     Heart sounds: Normal heart sounds. No murmur heard. Pulmonary:     Effort: Pulmonary effort is normal. No respiratory distress.     Breath sounds: Normal breath sounds. No wheezing.  Musculoskeletal:        General: No swelling. Normal range of motion.     Cervical back: Neck supple.  Lymphadenopathy:     Cervical: No cervical adenopathy.  Skin:    General: Skin is warm and dry.     Findings: No rash.  Neurological:     Mental Status: He is alert and oriented to person, place, and time.     Coordination: Coordination normal.  Psychiatric:        Behavior: Behavior normal.       Assessment & Plan:   Problem List Items Addressed This Visit        Cardiovascular and Mediastinum   HTN, goal below 140/90 - Primary   Relevant Medications   olmesartan-hydrochlorothiazide (BENICAR HCT) 20-12.5 MG tablet   Other Relevant Orders   CBC with Differential/Platelet   CMP14+EGFR   Lipid panel   Bayer DCA Hb A1c Waived   Ambulatory referral to Gastroenterology   CBC with Differential/Platelet   CMP14+EGFR   Lipid panel     Other   Dyslipidemia   Relevant Medications   olmesartan-hydrochlorothiazide (BENICAR HCT) 20-12.5 MG tablet   Other Relevant Orders   Ambulatory referral to Gastroenterology   CBC with Differential/Platelet   CMP14+EGFR   Lipid panel   Other Visit Diagnoses     Colon cancer screening       Relevant Orders   Ambulatory referral to Gastroenterology       Patient will do blood work.  Blood pressure slightly elevated, he will keep a close eye on it over the next 2 weeks call me in some numbers in the next 2 weeks. Follow up plan: Return in about 6 months (around 02/07/2022), or if symptoms worsen or fail to improve, for Physical exam and hypertension.  Counseling provided for all of the vaccine components Orders Placed This Encounter  Procedures   CBC with Differential/Platelet   CMP14+EGFR   Lipid panel   Bayer DCA Hb A1c Waived   CBC with Differential/Platelet   CMP14+EGFR   Lipid panel   Ambulatory referral to Gastroenterology    Caryl Pina, MD [facility] 08/08/2021, 2:55 PM

## 2021-08-09 LAB — CMP14+EGFR
ALT: 23 IU/L (ref 0–44)
AST: 21 IU/L (ref 0–40)
Albumin/Globulin Ratio: 1.6 (ref 1.2–2.2)
Albumin: 4.7 g/dL (ref 3.8–4.9)
Alkaline Phosphatase: 82 IU/L (ref 44–121)
BUN/Creatinine Ratio: 15 (ref 9–20)
BUN: 13 mg/dL (ref 6–24)
Bilirubin Total: 0.3 mg/dL (ref 0.0–1.2)
CO2: 23 mmol/L (ref 20–29)
Calcium: 9.7 mg/dL (ref 8.7–10.2)
Chloride: 103 mmol/L (ref 96–106)
Creatinine, Ser: 0.89 mg/dL (ref 0.76–1.27)
Globulin, Total: 2.9 g/dL (ref 1.5–4.5)
Glucose: 80 mg/dL (ref 70–99)
Potassium: 3.8 mmol/L (ref 3.5–5.2)
Sodium: 142 mmol/L (ref 134–144)
Total Protein: 7.6 g/dL (ref 6.0–8.5)
eGFR: 104 mL/min/{1.73_m2} (ref >59)

## 2021-08-09 LAB — CBC WITH DIFFERENTIAL/PLATELET
Basophils Absolute: 0.1 10*3/uL (ref 0.0–0.2)
Basos: 1 %
EOS (ABSOLUTE): 0.2 10*3/uL (ref 0.0–0.4)
Eos: 2 %
Hematocrit: 39.5 % (ref 37.5–51.0)
Hemoglobin: 12.9 g/dL — ABNORMAL LOW (ref 13.0–17.7)
Immature Grans (Abs): 0 10*3/uL (ref 0.0–0.1)
Immature Granulocytes: 0 %
Lymphocytes Absolute: 2.5 10*3/uL (ref 0.7–3.1)
Lymphs: 26 %
MCH: 28.2 pg (ref 26.6–33.0)
MCHC: 32.7 g/dL (ref 31.5–35.7)
MCV: 86 fL (ref 79–97)
Monocytes Absolute: 0.4 10*3/uL (ref 0.1–0.9)
Monocytes: 4 %
Neutrophils Absolute: 6.7 10*3/uL (ref 1.4–7.0)
Neutrophils: 67 %
Platelets: 369 10*3/uL (ref 150–450)
RBC: 4.57 x10E6/uL (ref 4.14–5.80)
RDW: 13.2 % (ref 11.6–15.4)
WBC: 9.9 10*3/uL (ref 3.4–10.8)

## 2021-08-09 LAB — LIPID PANEL
Chol/HDL Ratio: 4.1 ratio (ref 0.0–5.0)
Cholesterol, Total: 217 mg/dL — ABNORMAL HIGH (ref 100–199)
HDL: 53 mg/dL (ref >39)
LDL Chol Calc (NIH): 148 mg/dL — ABNORMAL HIGH (ref 0–99)
Triglycerides: 90 mg/dL (ref 0–149)
VLDL Cholesterol Cal: 16 mg/dL (ref 5–40)

## 2021-08-22 MED ORDER — ROSUVASTATIN CALCIUM 10 MG PO TABS: 10.0000 mg | ORAL_TABLET | Freq: Every day | ORAL | 1 refills | Status: DC

## 2021-08-22 NOTE — Addendum Note (Signed)
Addended byDene Gentry on: 08/22/2021 10:03 AM   Modules accepted: Orders

## 2022-03-02 ENCOUNTER — Other Ambulatory Visit: Payer: Self-pay | Admitting: Family Medicine

## 2022-03-08 ENCOUNTER — Other Ambulatory Visit: Payer: Self-pay | Admitting: Family Medicine

## 2022-04-26 ENCOUNTER — Encounter: Payer: Self-pay | Admitting: Family Medicine

## 2022-04-26 ENCOUNTER — Encounter: Payer: Managed Care, Other (non HMO) | Admitting: Family Medicine

## 2022-05-16 ENCOUNTER — Encounter: Payer: Self-pay | Admitting: Family Medicine

## 2022-06-12 ENCOUNTER — Ambulatory Visit (INDEPENDENT_AMBULATORY_CARE_PROVIDER_SITE_OTHER): Payer: Managed Care, Other (non HMO) | Admitting: Family Medicine

## 2022-06-12 ENCOUNTER — Encounter: Payer: Self-pay | Admitting: Family Medicine

## 2022-06-12 VITALS — BP 146/85 | HR 53 | Ht 73.0 in | Wt 258.0 lb

## 2022-06-12 DIAGNOSIS — Z0000 Encounter for general adult medical examination without abnormal findings: Secondary | ICD-10-CM

## 2022-06-12 DIAGNOSIS — Z125 Encounter for screening for malignant neoplasm of prostate: Secondary | ICD-10-CM

## 2022-06-12 DIAGNOSIS — I10 Essential (primary) hypertension: Secondary | ICD-10-CM

## 2022-06-12 DIAGNOSIS — Z0001 Encounter for general adult medical examination with abnormal findings: Secondary | ICD-10-CM | POA: Diagnosis not present

## 2022-06-12 DIAGNOSIS — Z1211 Encounter for screening for malignant neoplasm of colon: Secondary | ICD-10-CM

## 2022-06-12 DIAGNOSIS — E785 Hyperlipidemia, unspecified: Secondary | ICD-10-CM

## 2022-06-12 MED ORDER — OLMESARTAN MEDOXOMIL-HCTZ 20-12.5 MG PO TABS: 1.0000 | ORAL_TABLET | Freq: Every day | ORAL | 3 refills | Status: DC

## 2022-06-12 NOTE — Progress Notes (Signed)
BP (!) 146/85   Pulse (!) 53   Ht  (1.854 m)   Wt 258 lb (117 kg)   SpO2 99%   BMI 34.04 kg/m    Subjective:   Patient ID: Alex Howell, male    DOB: 1970-12-05, 52 y.o.   MRN: [id number]  HPI: Alex Howell is a 52 y.o. male presenting on 06/12/2022 for Medical Management of Chronic Issues (CPE)   HPI Physical exam Patient denies any chest pain, shortness of breath, headaches or vision issues, abdominal complaints, diarrhea, nausea, vomiting, or joint issues.   Hypertension Patient is currently on olmesartan hydrochlorothiazide, and their blood pressure today is 146/85 and 136/78. Patient denies any lightheadedness or dizziness. Patient denies headaches, blurred vision, chest pains, shortness of breath, or weakness. Denies any side effects from medication and is content with current medication.   Hyperlipidemia Patient is coming in for recheck of his hyperlipidemia. The patient is currently taking no medication, try the Crestor for 3 days got muscle aches and stopped it. They deny any issues with myalgias or history of liver damage from it. They deny any focal numbness or weakness or chest pain.   Relevant past medical, surgical, family and social history reviewed and updated as indicated. Interim medical history since our last visit reviewed. Allergies and medications reviewed and updated.  Review of Systems  Constitutional:  Negative for chills and fever.  HENT:  Negative for ear pain and tinnitus.   Eyes:  Negative for pain.  Respiratory:  Negative for cough, shortness of breath and wheezing.   Cardiovascular:  Negative for chest pain, palpitations and leg swelling.  Gastrointestinal:  Negative for abdominal pain, blood in stool, constipation and diarrhea.  Genitourinary:  Negative for dysuria and hematuria.  Musculoskeletal:  Negative for back pain and myalgias.  Skin:  Negative for rash.  Neurological:  Negative for dizziness, weakness and headaches.   Psychiatric/Behavioral:  Negative for suicidal ideas.     Per HPI unless specifically indicated above   Allergies as of 06/12/2022   No Known Allergies      Medication List        Accurate as of June 12, 2022  3:10 PM. If you have any questions, ask your nurse or doctor.          STOP taking these medications    rosuvastatin 10 MG tablet Commonly known as: CRESTOR Stopped by: Elige Radon Haston Casebolt, MD       TAKE these medications    olmesartan-hydrochlorothiazide 20-12.5 MG tablet Commonly known as: BENICAR HCT Take 1 tablet by mouth daily.         Objective:   BP (!) 146/85   Pulse (!) 53   Ht  (1.854 m)   Wt 258 lb (117 kg)   SpO2 99%   BMI 34.04 kg/m   Wt Readings from Last 3 Encounters:  06/12/22 258 lb (117 kg)  08/08/21 261 lb (118.4 kg)  05/26/20 261 lb (118.4 kg)    Physical Exam Vitals reviewed.  Constitutional:      General: He is not in acute distress.    Appearance: He is well-developed. He is not diaphoretic.  HENT:     Right Ear: External ear normal.     Left Ear: External ear normal.     Nose: Nose normal.     Mouth/Throat:     Pharynx: No oropharyngeal exudate.  Eyes:     General: No scleral icterus.    Conjunctiva/sclera:  Conjunctivae normal.  Neck:     Thyroid: No thyromegaly.  Cardiovascular:     Rate and Rhythm: Normal rate and regular rhythm.     Heart sounds: Normal heart sounds. No murmur heard. Pulmonary:     Effort: Pulmonary effort is normal. No respiratory distress.     Breath sounds: Normal breath sounds. No wheezing.  Abdominal:     General: Bowel sounds are normal. There is no distension.     Palpations: Abdomen is soft.     Tenderness: There is no abdominal tenderness. There is no guarding or rebound.  Musculoskeletal:        General: No swelling. Normal range of motion.     Cervical back: Neck supple.  Lymphadenopathy:     Cervical: No cervical adenopathy.  Skin:    General: Skin is warm and  dry.     Findings: No rash.  Neurological:     Mental Status: He is alert and oriented to person, place, and time.     Coordination: Coordination normal.  Psychiatric:        Behavior: Behavior normal.       Assessment & Plan:   Problem List Items Addressed This Visit       Cardiovascular and Mediastinum   HTN, goal below 140/90   Relevant Medications   olmesartan-hydrochlorothiazide (BENICAR HCT) 20-12.5 MG tablet   Other Relevant Orders   CBC with Differential/Platelet   CMP14+EGFR     Other   Dyslipidemia   Relevant Medications   olmesartan-hydrochlorothiazide (BENICAR HCT) 20-12.5 MG tablet   Other Relevant Orders   CBC with Differential/Platelet   CMP14+EGFR   Lipid panel   Other Visit Diagnoses     Physical exam    -  Primary   Relevant Orders   CBC with Differential/Platelet   CMP14+EGFR   Lipid panel   PSA, total and free   Colon cancer screening       Relevant Orders   Ambulatory referral to Gastroenterology   Prostate cancer screening       Relevant Orders   PSA, total and free     Continue blood pressure pill, he did not tolerate the Crestor, will discuss other options in the future    Follow up plan: Return in about 1 year (around 06/12/2023), or if symptoms worsen or fail to improve, for Physical exam.  Counseling provided for all of the vaccine components Orders Placed This Encounter  Procedures   CBC with Differential/Platelet   CMP14+EGFR   Lipid panel   PSA, total and free   Ambulatory referral to Gastroenterology    Arville Care, MD [facility] 06/12/2022, 3:10 PM

## 2022-06-13 LAB — LIPID PANEL
Chol/HDL Ratio: 4.5 ratio (ref 0.0–5.0)
Cholesterol, Total: 211 mg/dL — ABNORMAL HIGH (ref 100–199)
HDL: 47 mg/dL (ref >39)
LDL Chol Calc (NIH): 138 mg/dL — ABNORMAL HIGH (ref 0–99)
Triglycerides: 146 mg/dL (ref 0–149)
VLDL Cholesterol Cal: 26 mg/dL (ref 5–40)

## 2022-06-13 LAB — CBC WITH DIFFERENTIAL/PLATELET
Basophils Absolute: 0 10*3/uL (ref 0.0–0.2)
Basos: 1 %
EOS (ABSOLUTE): 0.2 10*3/uL (ref 0.0–0.4)
Eos: 2 %
Hematocrit: 38.2 % (ref 37.5–51.0)
Hemoglobin: 12.9 g/dL — ABNORMAL LOW (ref 13.0–17.7)
Immature Grans (Abs): 0 10*3/uL (ref 0.0–0.1)
Immature Granulocytes: 0 %
Lymphocytes Absolute: 2.3 10*3/uL (ref 0.7–3.1)
Lymphs: 28 %
MCH: 29.7 pg (ref 26.6–33.0)
MCHC: 33.8 g/dL (ref 31.5–35.7)
MCV: 88 fL (ref 79–97)
Monocytes Absolute: 0.5 10*3/uL (ref 0.1–0.9)
Monocytes: 6 %
Neutrophils Absolute: 5.2 10*3/uL (ref 1.4–7.0)
Neutrophils: 63 %
Platelets: 330 10*3/uL (ref 150–450)
RBC: 4.34 x10E6/uL (ref 4.14–5.80)
RDW: 12.7 % (ref 11.6–15.4)
WBC: 8.2 10*3/uL (ref 3.4–10.8)

## 2022-06-13 LAB — CMP14+EGFR
ALT: 22 IU/L (ref 0–44)
AST: 23 IU/L (ref 0–40)
Albumin/Globulin Ratio: 1.4 (ref 1.2–2.2)
Albumin: 4.3 g/dL (ref 3.8–4.9)
Alkaline Phosphatase: 86 IU/L (ref 44–121)
BUN/Creatinine Ratio: 12 (ref 9–20)
BUN: 11 mg/dL (ref 6–24)
Bilirubin Total: 0.3 mg/dL (ref 0.0–1.2)
CO2: 23 mmol/L (ref 20–29)
Calcium: 9.5 mg/dL (ref 8.7–10.2)
Chloride: 103 mmol/L (ref 96–106)
Creatinine, Ser: 0.89 mg/dL (ref 0.76–1.27)
Globulin, Total: 3.1 g/dL (ref 1.5–4.5)
Glucose: 85 mg/dL (ref 70–99)
Potassium: 3.8 mmol/L (ref 3.5–5.2)
Sodium: 141 mmol/L (ref 134–144)
Total Protein: 7.4 g/dL (ref 6.0–8.5)
eGFR: 103 mL/min/{1.73_m2} (ref >59)

## 2022-06-13 LAB — PSA, TOTAL AND FREE
PSA, Free Pct: 27.1 %
PSA, Free: 0.46 ng/mL
Prostate Specific Ag, Serum: 1.7 ng/mL (ref 0.0–4.0)

## 2022-06-14 ENCOUNTER — Encounter (INDEPENDENT_AMBULATORY_CARE_PROVIDER_SITE_OTHER): Payer: Self-pay | Admitting: *Deleted

## 2022-06-20 ENCOUNTER — Other Ambulatory Visit: Payer: Self-pay

## 2022-06-20 MED ORDER — EZETIMIBE 10 MG PO TABS: 10.0000 mg | ORAL_TABLET | Freq: Every day | ORAL | 1 refills | Status: DC

## 2022-12-14 ENCOUNTER — Encounter (INDEPENDENT_AMBULATORY_CARE_PROVIDER_SITE_OTHER): Payer: Self-pay | Admitting: *Deleted

## 2023-08-20 ENCOUNTER — Encounter: Payer: Self-pay | Admitting: Nurse Practitioner

## 2023-08-20 ENCOUNTER — Ambulatory Visit: Admitting: Nurse Practitioner

## 2023-08-20 ENCOUNTER — Ambulatory Visit: Payer: Self-pay | Admitting: Nurse Practitioner

## 2023-08-20 VITALS — BP 140/84 | HR 81 | Temp 98.2°F | Ht 73.0 in | Wt 263.0 lb

## 2023-08-20 DIAGNOSIS — R351 Nocturia: Secondary | ICD-10-CM | POA: Insufficient documentation

## 2023-08-20 DIAGNOSIS — R350 Frequency of micturition: Secondary | ICD-10-CM | POA: Insufficient documentation

## 2023-08-20 DIAGNOSIS — Z8042 Family history of malignant neoplasm of prostate: Secondary | ICD-10-CM | POA: Insufficient documentation

## 2023-08-20 DIAGNOSIS — R339 Retention of urine, unspecified: Secondary | ICD-10-CM | POA: Diagnosis not present

## 2023-08-20 LAB — URINALYSIS, ROUTINE W REFLEX MICROSCOPIC
Bilirubin, UA: NEGATIVE
Glucose, UA: NEGATIVE
Ketones, UA: NEGATIVE
Leukocytes,UA: NEGATIVE
Nitrite, UA: NEGATIVE
Protein,UA: NEGATIVE
RBC, UA: NEGATIVE
Specific Gravity, UA: 1.025 (ref 1.005–1.030)
Urobilinogen, Ur: 0.2 mg/dL (ref 0.2–1.0)
pH, UA: 5.5 (ref 5.0–7.5)

## 2023-08-20 LAB — GLUCOSE HEMOCUE WAIVED: Glu Hemocue Waived: 74 mg/dL (ref 70–99)

## 2023-08-20 MED ORDER — TAMSULOSIN HCL 0.4 MG PO CAPS: 0.4000 mg | ORAL_CAPSULE | Freq: Every day | ORAL | 0 refills | Status: DC

## 2023-08-20 NOTE — Progress Notes (Signed)
 Acute Office Visit  Subjective:     Patient ID: Alex Howell, male    DOB: 06-01-1970, 53 y.o.   MRN: [id number]  Chief Complaint  Patient presents with   Urinary Frequency    HPI  Alex Howell. [place]elka is a 53 year old male who presents on 08/20/2023 with complaints of urinary frequency and urgency for the past 3 weeks. He reports a sensation of incomplete bladder emptying and states he is waking 2-3 times per night to urinate. He denies flank pain, fever, or chills. He has a family history of prostate cancer--his grandfather was diagnosed with the condition.  Active Ambulatory Problems    Diagnosis Date Noted   Dyslipidemia 06/03/2012   HTN, goal below 140/90 06/03/2012   Urinary retention with incomplete bladder emptying 08/20/2023   Nocturia 08/20/2023   Family history of prostate cancer 08/20/2023   Increased urinary frequency 08/20/2023   Resolved Ambulatory Problems    Diagnosis Date Noted   Low back pain 06/03/2012   Sore throat 08/11/2020   Cough 08/11/2020   Past Medical History:  Diagnosis Date   Hypertension        Review of Systems  Constitutional:  Negative for chills and fever.  Respiratory:  Negative for cough and shortness of breath.   Cardiovascular:  Negative for chest pain and leg swelling.  Gastrointestinal:  Negative for diarrhea, nausea and vomiting.  Genitourinary:  Positive for frequency and urgency.  Skin:  Negative for itching and rash.  Neurological:  Negative for dizziness and headaches.   Negative unless indicated in HPI    Objective:    BP (!) 140/84   Pulse 81   Temp 98.2 F (36.8 C)   Ht 6' 1 (1.854 m)   Wt 263 lb (119.3 kg)   SpO2 95%   BMI 34.70 kg/m  BP Readings from Last 3 Encounters:  08/20/23 (!) 140/84  06/12/22 (!) 146/85  08/08/21 (!) 141/78   Wt Readings from Last 3 Encounters:  08/20/23 263 lb (119.3 kg)  06/12/22 258 lb (117 kg)  08/08/21 261 lb (118.4 kg)      Physical Exam Vitals and nursing note  reviewed.  Constitutional:      General: He is not in acute distress. HENT:     Head: Normocephalic and atraumatic.     Nose: Nose normal.   Eyes:     Extraocular Movements: Extraocular movements intact.     Conjunctiva/sclera: Conjunctivae normal.     Pupils: Pupils are equal, round, and reactive to light.    Cardiovascular:     Heart sounds: Normal heart sounds.  Pulmonary:     Effort: Pulmonary effort is normal.     Breath sounds: Normal breath sounds.  Abdominal:     General: There is no distension.     Tenderness: There is no abdominal tenderness. There is no right CVA tenderness or left CVA tenderness.   Musculoskeletal:        General: Normal range of motion.     Right lower leg: No edema.     Left lower leg: No edema.   Skin:    General: Skin is warm and dry.     Findings: No rash.   Neurological:     Mental Status: He is alert and oriented to person, place, and time.   Psychiatric:        Mood and Affect: Mood normal.        Behavior: Behavior normal.  Thought Content: Thought content normal.        Judgment: Judgment normal.    Urine dipstick shows negative for all components.  Micro exam: negative for WBC's or RBC's.  No results found for any visits on 08/20/23.      Assessment & Plan:  Urinary retention with incomplete bladder emptying -     Tamsulosin HCl; Take 1 capsule (0.4 mg total) by mouth daily.  Dispense: 30 capsule; Refill: 0  Nocturia -     Glucose Hemocue Waived -     PSA, total and free  Increased urinary frequency -     Glucose Hemocue Waived -     PSA, total and free -     Tamsulosin HCl; Take 1 capsule (0.4 mg total) by mouth daily.  Dispense: 30 capsule; Refill: 0  Family history of prostate cancer  Alex Howell is a 53 yrs old [place] male seen today for Urinary frequency/nocturia, no acute distress PSA order result pending BG level: Result pending Will start Flomax 0.4 mg daily while waiting for PSA  result The above assessment and management plan was discussed with the patient. The patient verbalized understanding of and has agreed to the management plan. Patient is aware to call the clinic if they develop any new symptoms or if symptoms persist or worsen. Patient is aware when to return to the clinic for a follow-up visit. Patient educated on when it is appropriate to go to the emergency department.  Return if symptoms worsen or fail to improve.  Marisella Puccio [place] Thompson, DNP [facility] [address] [place], [place] [address] [phone]  Note: This document was prepared by [employer] voice dictation technology and any errors that results from this process are unintentional.

## 2023-08-21 LAB — PSA, TOTAL AND FREE
PSA, Free Pct: 26.9 %
PSA, Free: 0.35 ng/mL
Prostate Specific Ag, Serum: 1.3 ng/mL (ref 0.0–4.0)

## 2023-08-24 ENCOUNTER — Other Ambulatory Visit: Payer: Self-pay | Admitting: Family Medicine

## 2023-08-24 DIAGNOSIS — E785 Hyperlipidemia, unspecified: Secondary | ICD-10-CM

## 2023-08-24 DIAGNOSIS — I10 Essential (primary) hypertension: Secondary | ICD-10-CM

## 2023-09-11 ENCOUNTER — Other Ambulatory Visit: Payer: Self-pay | Admitting: Nurse Practitioner

## 2023-09-11 DIAGNOSIS — R350 Frequency of micturition: Secondary | ICD-10-CM

## 2023-09-11 DIAGNOSIS — R339 Retention of urine, unspecified: Secondary | ICD-10-CM

## 2023-09-24 ENCOUNTER — Ambulatory Visit: Admitting: Family Medicine

## 2023-10-04 ENCOUNTER — Ambulatory Visit (INDEPENDENT_AMBULATORY_CARE_PROVIDER_SITE_OTHER): Admitting: Family Medicine

## 2023-10-04 ENCOUNTER — Encounter: Payer: Self-pay | Admitting: Family Medicine

## 2023-10-04 VITALS — BP 139/84 | HR 55 | Ht 73.0 in | Wt 258.0 lb

## 2023-10-04 DIAGNOSIS — Z0001 Encounter for general adult medical examination with abnormal findings: Secondary | ICD-10-CM | POA: Diagnosis not present

## 2023-10-04 DIAGNOSIS — Z8042 Family history of malignant neoplasm of prostate: Secondary | ICD-10-CM

## 2023-10-04 DIAGNOSIS — I10 Essential (primary) hypertension: Secondary | ICD-10-CM

## 2023-10-04 DIAGNOSIS — R351 Nocturia: Secondary | ICD-10-CM

## 2023-10-04 DIAGNOSIS — E785 Hyperlipidemia, unspecified: Secondary | ICD-10-CM | POA: Diagnosis not present

## 2023-10-04 DIAGNOSIS — R350 Frequency of micturition: Secondary | ICD-10-CM

## 2023-10-04 DIAGNOSIS — R339 Retention of urine, unspecified: Secondary | ICD-10-CM

## 2023-10-04 DIAGNOSIS — Z0000 Encounter for general adult medical examination without abnormal findings: Secondary | ICD-10-CM

## 2023-10-04 MED ORDER — OLMESARTAN MEDOXOMIL-HCTZ 20-12.5 MG PO TABS: 1.0000 | ORAL_TABLET | Freq: Every day | ORAL | 3 refills | Status: AC

## 2023-10-04 MED ORDER — TAMSULOSIN HCL 0.4 MG PO CAPS: 0.4000 mg | ORAL_CAPSULE | Freq: Every day | ORAL | 3 refills | Status: AC

## 2023-10-04 NOTE — Progress Notes (Signed)
 BP 139/84   Pulse (!) 55   Ht 6' 1 (1.854 m)   Wt 258 lb (117 kg)   SpO2 98%   BMI 34.04 kg/m    Subjective:   Patient ID: Alex Howell, male    DOB: [birth year], 53 y.o.   MRN: [id number]  HPI: Alex Howell is a 53 y.o. male presenting on 10/04/2023 for Medical Management of Chronic Issues (CPE), Hypertension, Urinary Frequency (And urine has bubbles in it), and Back Pain   Discussed the use of AI scribe software for clinical note transcription with the patient, who gave verbal consent to proceed.  History of Present Illness   Alex Howell is a 53 year old male with a history of prostate enlargement who presents for a physical exam and follow-up on prostate issues.  He has been experiencing urinary frequency, nocturia, and a sensation of incomplete bladder emptying. He has been taking tamsulosin  (Flomax ) for 30 days, which has significantly improved his symptoms, with a marked improvement in urinary flow. About a month ago, blood work revealed a PSA level of 1.7, which was lower than previous results, and no signs of infection in the urine.  He has noticed bubbles in his urine, which he associates with protein presence. 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 He finds it difficult to eat healthy while on the road and is trying to incorporate more exercise into his routine. He reports occasional back pain.  Family history is significant for prostate cancer in his grandfather. No kidney pain.      Relevant past medical, surgical, family and social history reviewed and updated as indicated. Interim medical history since our last visit reviewed. Allergies and medications reviewed and updated.  Review of Systems  Constitutional:  Negative for chills and fever.  HENT:  Negative for ear pain and tinnitus.   Eyes:  Negative for pain and visual disturbance.  Respiratory:  Negative for cough, shortness of breath  and wheezing.   Cardiovascular:  Negative for chest pain, palpitations and leg swelling.  Gastrointestinal:  Negative for abdominal pain, blood in stool, constipation and diarrhea.  Genitourinary:  Positive for frequency. Negative for dysuria and hematuria.  Musculoskeletal:  Negative for back pain, gait problem and myalgias.  Skin:  Negative for rash.  Neurological:  Negative for dizziness, weakness, light-headedness and headaches.  Psychiatric/Behavioral:  Negative for suicidal ideas.   All other systems reviewed and are negative.   Per HPI unless specifically indicated above   Allergies as of 10/04/2023   No Known Allergies      Medication List        Accurate as of October 04, 2023  2:48 PM. If you have any questions, ask your nurse or doctor.          STOP taking these medications    ezetimibe  10 MG tablet Commonly known as: Zetia  Stopped by: Fonda LABOR Wynter Isaacs       TAKE these medications    olmesartan -hydrochlorothiazide  20-12.5 MG tablet Commonly known as: BENICAR  HCT Take 1 tablet by mouth daily.   tamsulosin  0.4 MG Caps capsule Commonly known as: FLOMAX  Take 1 capsule (0.4 mg total) by mouth daily.         Objective:   BP 139/84   Pulse (!) 55   Ht 6' 1 (1.854 m)   Wt 258 lb (117 kg)   SpO2 98%   BMI 34.04 kg/m  Wt Readings from Last 3 Encounters:  10/04/23 258 lb (117 kg)  08/20/23 263 lb (119.3 kg)  06/12/22 258 lb (117 kg)    Physical Exam Physical Exam   HEENT: Minimal cerumen in both ears. Pharynx normal. NECK: Thyroid without nodules. CHEST: Lungs clear to auscultation bilaterally. CARDIOVASCULAR: Heart regular rate and rhythm, no murmurs. ABDOMEN: No costovertebral angle tenderness. Abdomen non-tender. RECTAL: Prostate enlarged, no nodules. EXTREMITIES: No edema in lower extremities. MUSCULOSKELETAL: Knee reflexes normal.         Assessment & Plan:   Problem List Items Addressed This Visit       Cardiovascular and  Mediastinum   HTN, goal below 140/90   Relevant Medications   olmesartan -hydrochlorothiazide  (BENICAR  HCT) 20-12.5 MG tablet   Other Relevant Orders   CBC with Differential/Platelet   CMP14+EGFR   Lipid panel     Other   Dyslipidemia   Relevant Medications   olmesartan -hydrochlorothiazide  (BENICAR  HCT) 20-12.5 MG tablet   Other Relevant Orders   CBC with Differential/Platelet   CMP14+EGFR   Lipid panel   Urinary retention with incomplete bladder emptying   Relevant Medications   tamsulosin  (FLOMAX ) 0.4 MG CAPS capsule   Nocturia   Family history of prostate cancer   Increased urinary frequency   Relevant Medications   tamsulosin  (FLOMAX ) 0.4 MG CAPS capsule   Other Visit Diagnoses       Physical exam    -  Primary   Relevant Orders   CBC with Differential/Platelet   CMP14+EGFR   Lipid panel           Benign prostatic hyperplasia with lower urinary tract symptoms BPH with urinary frequency, nocturia, and incomplete bladder emptying. PSA levels stable, no cancer indicators. Symptoms improved with tamsulosin . - Continue tamsulosin  daily. - Monitor symptoms, consider additional medication if needed. - Discuss surgical options if medications fail. - Annual PSA testing.  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 - Advise ergonomic adjustments in the truck. - Encourage regular breaks for stretching.  Obesity Obesity with weight management challenges due to truck driving occupation. - Encourage physical activity during breaks. - Advise on dietary modifications for healthier choices.  Hyperlipidemia Hyperlipidemia with previous Zetia  intolerance. Current cholesterol levels pending. - Order blood work for cholesterol levels. - Discuss alternative medications based on results.  General Health Maintenance Discussed importance of regular screenings and lifestyle changes. - Order comprehensive blood work. - Initiate  colonoscopy referral.          Follow up plan: Return in about 1 year (around 10/03/2024), or if symptoms worsen or fail to improve, for physical exam.  Counseling provided for all of the vaccine components Orders Placed This Encounter  Procedures   CBC with Differential/Platelet   CMP14+EGFR   Lipid panel    Fonda Levins, MD [facility] 10/04/2023, 2:48 PM

## 2023-10-05 LAB — CMP14+EGFR
ALT: 18 IU/L (ref 0–44)
AST: 26 IU/L (ref 0–40)
Albumin: 4.5 g/dL (ref 3.8–4.9)
Alkaline Phosphatase: 78 IU/L (ref 44–121)
BUN/Creatinine Ratio: 11 (ref 9–20)
BUN: 11 mg/dL (ref 6–24)
Bilirubin Total: 0.4 mg/dL (ref 0.0–1.2)
CO2: 23 mmol/L (ref 20–29)
Calcium: 9.7 mg/dL (ref 8.7–10.2)
Chloride: 102 mmol/L (ref 96–106)
Creatinine, Ser: 0.97 mg/dL (ref 0.76–1.27)
Globulin, Total: 3 g/dL (ref 1.5–4.5)
Glucose: 84 mg/dL (ref 70–99)
Potassium: 3.8 mmol/L (ref 3.5–5.2)
Sodium: 140 mmol/L (ref 134–144)
Total Protein: 7.5 g/dL (ref 6.0–8.5)
eGFR: 93 mL/min/1.73 (ref >59)

## 2023-10-05 LAB — LIPID PANEL
Chol/HDL Ratio: 4.4 ratio (ref 0.0–5.0)
Cholesterol, Total: 224 mg/dL — ABNORMAL HIGH (ref 100–199)
HDL: 51 mg/dL (ref >39)
LDL Chol Calc (NIH): 159 mg/dL — ABNORMAL HIGH (ref 0–99)
Triglycerides: 82 mg/dL (ref 0–149)
VLDL Cholesterol Cal: 14 mg/dL (ref 5–40)

## 2023-10-05 LAB — CBC WITH DIFFERENTIAL/PLATELET
Basophils Absolute: 0.1 x10E3/uL (ref 0.0–0.2)
Basos: 1 %
EOS (ABSOLUTE): 0.1 x10E3/uL (ref 0.0–0.4)
Eos: 1 %
Hematocrit: 39.8 % (ref 37.5–51.0)
Hemoglobin: 13.2 g/dL (ref 13.0–17.7)
Immature Grans (Abs): 0 x10E3/uL (ref 0.0–0.1)
Immature Granulocytes: 0 %
Lymphocytes Absolute: 2.4 x10E3/uL (ref 0.7–3.1)
Lymphs: 25 %
MCH: 29.8 pg (ref 26.6–33.0)
MCHC: 33.2 g/dL (ref 31.5–35.7)
MCV: 90 fL (ref 79–97)
Monocytes Absolute: 0.5 x10E3/uL (ref 0.1–0.9)
Monocytes: 5 %
Neutrophils Absolute: 6.6 x10E3/uL (ref 1.4–7.0)
Neutrophils: 68 %
Platelets: 337 x10E3/uL (ref 150–450)
RBC: 4.43 x10E6/uL (ref 4.14–5.80)
RDW: 13.2 % (ref 11.6–15.4)
WBC: 9.7 x10E3/uL (ref 3.4–10.8)

## 2023-10-12 ENCOUNTER — Ambulatory Visit: Payer: Self-pay | Admitting: Family Medicine

## 2023-10-12 ENCOUNTER — Other Ambulatory Visit: Payer: Self-pay

## 2023-10-12 MED ORDER — ROSUVASTATIN CALCIUM 5 MG PO TABS: 5.0000 mg | ORAL_TABLET | Freq: Every day | ORAL | 3 refills | Status: AC

## 2024-10-06 ENCOUNTER — Encounter: Payer: Self-pay | Admitting: Family Medicine
